# Patient Record
Sex: Female | Born: 1935 | Race: White | Hispanic: No | Marital: Single | Smoking: Never smoker
Health system: Southern US, Community
[De-identification: ages and names within clinical notes are randomized; demographics above are authoritative.]

## PROBLEM LIST (undated history)

## (undated) DIAGNOSIS — I10 Essential (primary) hypertension: Secondary | ICD-10-CM

## (undated) DIAGNOSIS — F419 Anxiety disorder, unspecified: Secondary | ICD-10-CM

## (undated) DIAGNOSIS — J189 Pneumonia, unspecified organism: Secondary | ICD-10-CM

## (undated) DIAGNOSIS — K219 Gastro-esophageal reflux disease without esophagitis: Secondary | ICD-10-CM

## (undated) DIAGNOSIS — R112 Nausea with vomiting, unspecified: Secondary | ICD-10-CM

## (undated) DIAGNOSIS — K900 Celiac disease: Secondary | ICD-10-CM

## (undated) DIAGNOSIS — Z9889 Other specified postprocedural states: Secondary | ICD-10-CM

## (undated) DIAGNOSIS — E039 Hypothyroidism, unspecified: Secondary | ICD-10-CM

## (undated) DIAGNOSIS — S37032A Laceration of left kidney, unspecified degree, initial encounter: Secondary | ICD-10-CM

## (undated) DIAGNOSIS — T8859XA Other complications of anesthesia, initial encounter: Secondary | ICD-10-CM

## (undated) DIAGNOSIS — S36039A Unspecified laceration of spleen, initial encounter: Secondary | ICD-10-CM

## (undated) DIAGNOSIS — G43909 Migraine, unspecified, not intractable, without status migrainosus: Secondary | ICD-10-CM

## (undated) DIAGNOSIS — S62101A Fracture of unspecified carpal bone, right wrist, initial encounter for closed fracture: Secondary | ICD-10-CM

## (undated) DIAGNOSIS — M5430 Sciatica, unspecified side: Secondary | ICD-10-CM

## (undated) DIAGNOSIS — S32402A Unspecified fracture of left acetabulum, initial encounter for closed fracture: Secondary | ICD-10-CM

## (undated) DIAGNOSIS — T4145XA Adverse effect of unspecified anesthetic, initial encounter: Secondary | ICD-10-CM

## (undated) HISTORY — PX: CHOLECYSTECTOMY: SHX55

## (undated) HISTORY — PX: EYE SURGERY: SHX253

## (undated) HISTORY — PX: SPINE SURGERY: SHX786

## (undated) HISTORY — PX: ABDOMINAL HYSTERECTOMY: SHX81

---

## 2013-04-30 DIAGNOSIS — S36039A Unspecified laceration of spleen, initial encounter: Secondary | ICD-10-CM

## 2013-04-30 DIAGNOSIS — S32402A Unspecified fracture of left acetabulum, initial encounter for closed fracture: Secondary | ICD-10-CM

## 2013-04-30 DIAGNOSIS — S37032A Laceration of left kidney, unspecified degree, initial encounter: Secondary | ICD-10-CM

## 2013-04-30 HISTORY — DX: Unspecified fracture of left acetabulum, initial encounter for closed fracture: S32.402A

## 2013-04-30 HISTORY — DX: Unspecified laceration of spleen, initial encounter: S36.039A

## 2013-04-30 HISTORY — DX: Laceration of left kidney, unspecified degree, initial encounter: S37.032A

## 2013-05-27 ENCOUNTER — Encounter (HOSPITAL_COMMUNITY): Payer: Self-pay | Admitting: Emergency Medicine

## 2013-05-27 ENCOUNTER — Inpatient Hospital Stay (HOSPITAL_COMMUNITY): Payer: Medicare PPO

## 2013-05-27 ENCOUNTER — Emergency Department (HOSPITAL_COMMUNITY): Payer: Medicare PPO

## 2013-05-27 ENCOUNTER — Inpatient Hospital Stay (HOSPITAL_COMMUNITY)
Admission: EM | Admit: 2013-05-27 | Discharge: 2013-06-05 | DRG: 964 | Disposition: A | Discharge status: Skilled Nursing Facility | Payer: Medicare PPO | Attending: General Surgery | Admitting: General Surgery

## 2013-05-27 DIAGNOSIS — S62101A Fracture of unspecified carpal bone, right wrist, initial encounter for closed fracture: Secondary | ICD-10-CM

## 2013-05-27 DIAGNOSIS — M25571 Pain in right ankle and joints of right foot: Secondary | ICD-10-CM | POA: Diagnosis present

## 2013-05-27 DIAGNOSIS — S36039A Unspecified laceration of spleen, initial encounter: Secondary | ICD-10-CM

## 2013-05-27 DIAGNOSIS — M5430 Sciatica, unspecified side: Secondary | ICD-10-CM | POA: Diagnosis present

## 2013-05-27 DIAGNOSIS — I10 Essential (primary) hypertension: Secondary | ICD-10-CM | POA: Diagnosis present

## 2013-05-27 DIAGNOSIS — D62 Acute posthemorrhagic anemia: Secondary | ICD-10-CM | POA: Diagnosis present

## 2013-05-27 DIAGNOSIS — S36113A Laceration of liver, unspecified degree, initial encounter: Secondary | ICD-10-CM

## 2013-05-27 DIAGNOSIS — S32409A Unspecified fracture of unspecified acetabulum, initial encounter for closed fracture: Secondary | ICD-10-CM | POA: Diagnosis present

## 2013-05-27 DIAGNOSIS — S32402A Unspecified fracture of left acetabulum, initial encounter for closed fracture: Secondary | ICD-10-CM

## 2013-05-27 DIAGNOSIS — S37002A Unspecified injury of left kidney, initial encounter: Secondary | ICD-10-CM

## 2013-05-27 DIAGNOSIS — K5900 Constipation, unspecified: Secondary | ICD-10-CM | POA: Diagnosis not present

## 2013-05-27 DIAGNOSIS — Z7982 Long term (current) use of aspirin: Secondary | ICD-10-CM

## 2013-05-27 DIAGNOSIS — S37039A Laceration of unspecified kidney, unspecified degree, initial encounter: Secondary | ICD-10-CM | POA: Diagnosis present

## 2013-05-27 DIAGNOSIS — S3609XA Other injury of spleen, initial encounter: Principal | ICD-10-CM | POA: Diagnosis present

## 2013-05-27 DIAGNOSIS — S62109A Fracture of unspecified carpal bone, unspecified wrist, initial encounter for closed fracture: Secondary | ICD-10-CM | POA: Diagnosis present

## 2013-05-27 DIAGNOSIS — Z79899 Other long term (current) drug therapy: Secondary | ICD-10-CM

## 2013-05-27 HISTORY — DX: Essential (primary) hypertension: I10

## 2013-05-27 HISTORY — DX: Migraine, unspecified, not intractable, without status migrainosus: G43.909

## 2013-05-27 HISTORY — DX: Celiac disease: K90.0

## 2013-05-27 HISTORY — DX: Sciatica, unspecified side: M54.30

## 2013-05-27 LAB — CBC
HCT: 31.3 % — ABNORMAL LOW (ref 36.0–46.0)
MCH: 31.1 pg (ref 26.0–34.0)
MCHC: 34.5 g/dL (ref 30.0–36.0)
MCV: 90.2 fL (ref 78.0–100.0)
RDW: 13 % (ref 11.5–15.5)
WBC: 15.8 10*3/uL — ABNORMAL HIGH (ref 4.0–10.5)

## 2013-05-27 LAB — COMPREHENSIVE METABOLIC PANEL
Albumin: 3.4 g/dL — ABNORMAL LOW (ref 3.5–5.2)
BUN: 19 mg/dL (ref 6–23)
Calcium: 8.6 mg/dL (ref 8.4–10.5)
Creatinine, Ser: 1.06 mg/dL (ref 0.50–1.10)
Glucose, Bld: 113 mg/dL — ABNORMAL HIGH (ref 70–99)
Potassium: 3.9 mEq/L (ref 3.5–5.1)
Total Protein: 6.6 g/dL (ref 6.0–8.3)

## 2013-05-27 LAB — CBC WITH DIFFERENTIAL/PLATELET
Basophils Absolute: 0 10*3/uL (ref 0.0–0.1)
Eosinophils Absolute: 0 10*3/uL (ref 0.0–0.7)
HCT: 36 % (ref 36.0–46.0)
Lymphocytes Relative: 11 % — ABNORMAL LOW (ref 12–46)
MCHC: 34.4 g/dL (ref 30.0–36.0)
Monocytes Relative: 6 % (ref 3–12)
Neutro Abs: 21.6 10*3/uL — ABNORMAL HIGH (ref 1.7–7.7)
Platelets: 258 10*3/uL (ref 150–400)
RBC: 3.95 MIL/uL (ref 3.87–5.11)
RDW: 13.1 % (ref 11.5–15.5)
WBC: 26.1 10*3/uL — ABNORMAL HIGH (ref 4.0–10.5)

## 2013-05-27 LAB — CG4 I-STAT (LACTIC ACID): Lactic Acid, Venous: 2.05 mmol/L (ref 0.5–2.2)

## 2013-05-27 MED ORDER — PANCRELIPASE (LIP-PROT-AMYL) 12000-38000 UNITS PO CPEP
2.0000 | ORAL_CAPSULE | Freq: Three times a day (TID) | ORAL | Status: DC
  Administered 2013-05-29 – 2013-06-05 (×22): 2 via ORAL
  Filled 2013-05-27 (×28): qty 2

## 2013-05-27 MED ORDER — KCL IN DEXTROSE-NACL 20-5-0.45 MEQ/L-%-% IV SOLN
INTRAVENOUS | Status: DC
  Administered 2013-05-27: 50 mL/h via INTRAVENOUS
  Administered 2013-05-28 – 2013-05-30 (×2): via INTRAVENOUS
  Filled 2013-05-27 (×6): qty 1000

## 2013-05-27 MED ORDER — MORPHINE SULFATE 2 MG/ML IJ SOLN
2.0000 mg | INTRAMUSCULAR | Status: DC | PRN
  Administered 2013-05-27: 2 mg via INTRAVENOUS
  Administered 2013-05-27: 4 mg via INTRAVENOUS
  Administered 2013-05-28 (×5): 2 mg via INTRAVENOUS
  Filled 2013-05-27: qty 1
  Filled 2013-05-27: qty 2
  Filled 2013-05-27 (×5): qty 1

## 2013-05-27 MED ORDER — ONDANSETRON HCL 4 MG PO TABS: 4.0000 mg | ORAL_TABLET | Freq: Four times a day (QID) | ORAL | Status: DC | PRN

## 2013-05-27 MED ORDER — ONDANSETRON HCL 4 MG/2ML IJ SOLN
4.0000 mg | Freq: Four times a day (QID) | INTRAMUSCULAR | Status: DC | PRN
  Administered 2013-05-28 – 2013-06-04 (×3): 4 mg via INTRAVENOUS
  Filled 2013-05-27 (×4): qty 2

## 2013-05-27 MED ORDER — ALPRAZOLAM 0.5 MG PO TABS
1.0000 mg | ORAL_TABLET | Freq: Three times a day (TID) | ORAL | Status: DC | PRN
  Administered 2013-05-27 – 2013-06-05 (×23): 1 mg via ORAL
  Filled 2013-05-27 (×22): qty 2
  Filled 2013-05-27: qty 4
  Filled 2013-05-27 (×2): qty 2

## 2013-05-27 MED ORDER — PREDNISOLONE ACETATE 1 % OP SUSP
1.0000 [drp] | Freq: Two times a day (BID) | OPHTHALMIC | Status: DC
  Administered 2013-05-27 – 2013-06-05 (×18): 1 [drp] via OPHTHALMIC
  Filled 2013-05-27: qty 5
  Filled 2013-05-27: qty 1

## 2013-05-27 MED ORDER — LEVOTHYROXINE SODIUM 75 MCG PO TABS
75.0000 ug | ORAL_TABLET | Freq: Every day | ORAL | Status: DC
  Administered 2013-05-28 – 2013-06-05 (×9): 75 ug via ORAL
  Filled 2013-05-27 (×13): qty 1

## 2013-05-27 MED ORDER — LORAZEPAM 2 MG/ML IJ SOLN
0.5000 mg | Freq: Once | INTRAMUSCULAR | Status: AC
  Administered 2013-05-27: 0.5 mg via INTRAVENOUS
  Filled 2013-05-27: qty 1

## 2013-05-27 MED ORDER — METOPROLOL SUCCINATE ER 25 MG PO TB24
25.0000 mg | ORAL_TABLET | Freq: Two times a day (BID) | ORAL | Status: DC
  Administered 2013-05-31 – 2013-06-05 (×7): 25 mg via ORAL
  Filled 2013-05-27 (×22): qty 1

## 2013-05-27 MED ORDER — PANTOPRAZOLE SODIUM 40 MG PO TBEC
40.0000 mg | DELAYED_RELEASE_TABLET | Freq: Every day | ORAL | Status: DC
  Administered 2013-05-30 – 2013-06-05 (×7): 40 mg via ORAL
  Filled 2013-05-27 (×7): qty 1

## 2013-05-27 MED ORDER — PANCRELIPASE (LIP-PROT-AMYL) 12000-38000 UNITS PO CPEP: 1.0000 | ORAL_CAPSULE | ORAL | Status: DC

## 2013-05-27 MED ORDER — TRAMADOL HCL 50 MG PO TABS
50.0000 mg | ORAL_TABLET | Freq: Four times a day (QID) | ORAL | Status: DC | PRN
  Administered 2013-05-28 – 2013-05-30 (×5): 50 mg via ORAL
  Filled 2013-05-27 (×5): qty 1

## 2013-05-27 MED ORDER — IOHEXOL 300 MG/ML  SOLN
100.0000 mL | Freq: Once | INTRAMUSCULAR | Status: AC | PRN
  Administered 2013-05-27: 100 mL via INTRAVENOUS

## 2013-05-27 MED ORDER — PANTOPRAZOLE SODIUM 40 MG IV SOLR
40.0000 mg | Freq: Every day | INTRAVENOUS | Status: DC
  Administered 2013-05-27 – 2013-05-29 (×3): 40 mg via INTRAVENOUS
  Filled 2013-05-27 (×4): qty 40
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
## 2013-05-27 NOTE — ED Notes (Signed)
Pt returned to exam room. c-collar removed per EDP. Vital signs stable.
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
## 2013-05-27 NOTE — ED Notes (Signed)
Pt sleepy and lethargic at the time. Responding to painful stimuli. Hypotensive on cardiac monitor. Family at bedside. EDP also at bedside, to have STAT CT scans per Dr. Wilkie Aye.

## 2013-05-27 NOTE — ED Notes (Signed)
Per Aundra Millet, RN- Dr. Wilkie Aye is now waiving labs, and wants to proceed with contrast CT without labs
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
## 2013-05-27 NOTE — ED Notes (Signed)
Pt remains in x-ray and CT scan.
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
## 2013-05-27 NOTE — ED Notes (Signed)
Pt transported to CT scan.

## 2013-05-27 NOTE — ED Notes (Signed)
Foley catheter inserted without difficulty. No urine output at the time. Vital signs stable. Pt and family updated on plan for admission.

## 2013-05-27 NOTE — ED Notes (Signed)
(  3) silver bracelets, (1) set of silver starfish earrings and (1) green ring placed in container. Clothing cut off, black boots placed in bag.

## 2013-05-28 ENCOUNTER — Inpatient Hospital Stay (HOSPITAL_COMMUNITY): Payer: Medicare PPO

## 2013-05-28 DIAGNOSIS — M5430 Sciatica, unspecified side: Secondary | ICD-10-CM | POA: Insufficient documentation

## 2013-05-28 DIAGNOSIS — G43909 Migraine, unspecified, not intractable, without status migrainosus: Secondary | ICD-10-CM | POA: Insufficient documentation

## 2013-05-28 DIAGNOSIS — K900 Celiac disease: Secondary | ICD-10-CM | POA: Insufficient documentation

## 2013-05-28 DIAGNOSIS — I10 Essential (primary) hypertension: Secondary | ICD-10-CM | POA: Insufficient documentation

## 2013-05-28 DIAGNOSIS — D62 Acute posthemorrhagic anemia: Secondary | ICD-10-CM

## 2013-05-28 LAB — BASIC METABOLIC PANEL
BUN: 18 mg/dL (ref 6–23)
CO2: 24 mEq/L (ref 19–32)
Calcium: 7.9 mg/dL — ABNORMAL LOW (ref 8.4–10.5)
Chloride: 103 mEq/L (ref 96–112)
Creatinine, Ser: 0.93 mg/dL (ref 0.50–1.10)
GFR calc Af Amer: 66 mL/min — ABNORMAL LOW (ref >90)
GFR calc non Af Amer: 57 mL/min — ABNORMAL LOW (ref >90)

## 2013-05-28 LAB — URINALYSIS, ROUTINE W REFLEX MICROSCOPIC
Leukocytes, UA: NEGATIVE
Nitrite: NEGATIVE
Specific Gravity, Urine: 1.01 (ref 1.005–1.030)
pH: 7 (ref 5.0–8.0)

## 2013-05-28 LAB — CBC
HCT: 26.6 % — ABNORMAL LOW (ref 36.0–46.0)
HCT: 28.1 % — ABNORMAL LOW (ref 36.0–46.0)
Hemoglobin: 9 g/dL — ABNORMAL LOW (ref 12.0–15.0)
Hemoglobin: 9.7 g/dL — ABNORMAL LOW (ref 12.0–15.0)
MCH: 30.7 pg (ref 26.0–34.0)
MCH: 31.5 pg (ref 26.0–34.0)
MCHC: 34.1 g/dL (ref 30.0–36.0)
MCHC: 35.7 g/dL (ref 30.0–36.0)
MCHC: 34.5 g/dL (ref 30.0–36.0)
MCV: 89 fL (ref 78.0–100.0)
Platelets: 173 10*3/uL (ref 150–400)
Platelets: 175 10*3/uL (ref 150–400)
RBC: 2.99 MIL/uL — ABNORMAL LOW (ref 3.87–5.11)
RBC: 3.08 MIL/uL — ABNORMAL LOW (ref 3.87–5.11)
RDW: 13.1 % (ref 11.5–15.5)
RDW: 13.2 % (ref 11.5–15.5)
WBC: 11.4 10*3/uL — ABNORMAL HIGH (ref 4.0–10.5)
WBC: 10.5 10*3/uL (ref 4.0–10.5)

## 2013-05-28 LAB — PREPARE RBC (CROSSMATCH)

## 2013-05-28 LAB — PROTIME-INR: Prothrombin Time: 15.3 seconds — ABNORMAL HIGH (ref 11.6–15.2)

## 2013-05-28 MED ORDER — ALBUMIN HUMAN 5 % IV SOLN
12.5000 g | Freq: Once | INTRAVENOUS | Status: AC
  Administered 2013-05-28: 12.5 g via INTRAVENOUS
  Filled 2013-05-28: qty 250

## 2013-05-28 MED ORDER — CALCIUM CARBONATE ANTACID 500 MG PO CHEW
400.0000 mg | CHEWABLE_TABLET | ORAL | Status: DC | PRN
  Administered 2013-05-28 – 2013-05-29 (×2): 400 mg via ORAL
  Filled 2013-05-28 (×3): qty 2

## 2013-05-28 NOTE — Treatment Plan (Signed)
XRs of foot and ankle negative for fx - treat symptomatically F/u 2 weeks in office.  Mayra Reel, MD [facility] [phone] 6:03 PM
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
## 2013-05-28 NOTE — Progress Notes (Signed)
UR completed.  Zalmen Wrightsman, RN BSN [facility] CCM Trauma/Neuro ICU Case Manager [phone]  
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
## 2013-05-29 LAB — CBC
Hemoglobin: 9.4 g/dL — ABNORMAL LOW (ref 12.0–15.0)
MCH: 31.3 pg (ref 26.0–34.0)
MCV: 91.7 fL (ref 78.0–100.0)
Platelets: 180 10*3/uL (ref 150–400)
RBC: 3 MIL/uL — ABNORMAL LOW (ref 3.87–5.11)
RDW: 12.9 % (ref 11.5–15.5)
WBC: 13.1 10*3/uL — ABNORMAL HIGH (ref 4.0–10.5)

## 2013-05-29 LAB — URINE CULTURE
Colony Count: NO GROWTH
Culture: NO GROWTH

## 2013-05-29 MED ORDER — DIPHENHYDRAMINE HCL 50 MG/ML IJ SOLN
12.5000 mg | Freq: Four times a day (QID) | INTRAMUSCULAR | Status: DC | PRN
  Administered 2013-05-29 – 2013-06-01 (×6): 12.5 mg via INTRAVENOUS
  Filled 2013-05-29 (×6): qty 1

## 2013-05-29 MED ORDER — DIPHENHYDRAMINE HCL 50 MG/ML IJ SOLN: 12.5000 mg | Freq: Four times a day (QID) | INTRAMUSCULAR | Status: DC | PRN
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
## 2013-05-30 DIAGNOSIS — S62109A Fracture of unspecified carpal bone, unspecified wrist, initial encounter for closed fracture: Secondary | ICD-10-CM

## 2013-05-30 DIAGNOSIS — D62 Acute posthemorrhagic anemia: Secondary | ICD-10-CM | POA: Diagnosis not present

## 2013-05-30 DIAGNOSIS — S32409A Unspecified fracture of unspecified acetabulum, initial encounter for closed fracture: Secondary | ICD-10-CM

## 2013-05-30 DIAGNOSIS — S62101A Fracture of unspecified carpal bone, right wrist, initial encounter for closed fracture: Secondary | ICD-10-CM

## 2013-05-30 LAB — CBC
HCT: 28.2 % — ABNORMAL LOW (ref 36.0–46.0)
Hemoglobin: 9.5 g/dL — ABNORMAL LOW (ref 12.0–15.0)
MCH: 31 pg (ref 26.0–34.0)
MCHC: 33.7 g/dL (ref 30.0–36.0)
RBC: 3.06 MIL/uL — ABNORMAL LOW (ref 3.87–5.11)
RDW: 12.9 % (ref 11.5–15.5)
WBC: 15.2 10*3/uL — ABNORMAL HIGH (ref 4.0–10.5)

## 2013-05-30 LAB — BASIC METABOLIC PANEL
BUN: 10 mg/dL (ref 6–23)
CO2: 25 mEq/L (ref 19–32)
Chloride: 104 mEq/L (ref 96–112)
GFR calc Af Amer: 89 mL/min — ABNORMAL LOW (ref >90)
GFR calc non Af Amer: 77 mL/min — ABNORMAL LOW (ref >90)
Potassium: 4.4 mEq/L (ref 3.7–5.3)
Sodium: 137 mEq/L (ref 137–147)

## 2013-05-30 MED ORDER — TRAMADOL HCL 50 MG PO TABS
50.0000 mg | ORAL_TABLET | Freq: Four times a day (QID) | ORAL | Status: DC | PRN
  Administered 2013-05-30 – 2013-05-31 (×5): 100 mg via ORAL
  Administered 2013-06-01: 50 mg via ORAL
  Administered 2013-06-01: 100 mg via ORAL
  Administered 2013-06-02 (×2): 50 mg via ORAL
  Administered 2013-06-02 – 2013-06-05 (×8): 100 mg via ORAL
  Filled 2013-05-30 (×4): qty 2
  Filled 2013-05-30: qty 1
  Filled 2013-05-30 (×4): qty 2
  Filled 2013-05-30: qty 1
  Filled 2013-05-30 (×7): qty 2

## 2013-05-30 MED ORDER — MORPHINE SULFATE 2 MG/ML IJ SOLN
2.0000 mg | INTRAMUSCULAR | Status: DC | PRN
  Administered 2013-06-04: 2 mg via INTRAVENOUS
  Filled 2013-05-30: qty 1

## 2013-05-30 MED ORDER — METHOCARBAMOL 750 MG PO TABS
1500.0000 mg | ORAL_TABLET | Freq: Four times a day (QID) | ORAL | Status: DC
  Administered 2013-05-30 – 2013-06-05 (×25): 1500 mg via ORAL
  Filled 2013-05-30 (×28): qty 2

## 2013-05-30 MED ORDER — POLYETHYLENE GLYCOL 3350 17 G PO PACK
17.0000 g | PACK | Freq: Every day | ORAL | Status: DC
  Filled 2013-05-30 (×7): qty 1

## 2013-05-30 MED ORDER — DOCUSATE SODIUM 100 MG PO CAPS
100.0000 mg | ORAL_CAPSULE | Freq: Two times a day (BID) | ORAL | Status: DC
  Administered 2013-05-30 – 2013-06-05 (×13): 100 mg via ORAL
  Filled 2013-05-30 (×14): qty 1
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
## 2013-05-30 NOTE — Progress Notes (Signed)
Will get up with PT. Patient examined and I agree with the assessment and plan  [name], MD, MPH, FACS Pager: [phone]  05/30/2013 10:53 AM
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
## 2013-05-31 DIAGNOSIS — S62101A Fracture of unspecified carpal bone, right wrist, initial encounter for closed fracture: Secondary | ICD-10-CM

## 2013-05-31 HISTORY — DX: Fracture of unspecified carpal bone, right wrist, initial encounter for closed fracture: S62.101A

## 2013-05-31 LAB — CBC
HCT: 26.4 % — ABNORMAL LOW (ref 36.0–46.0)
Hemoglobin: 8.9 g/dL — ABNORMAL LOW (ref 12.0–15.0)
MCH: 30.8 pg (ref 26.0–34.0)
MCHC: 33.7 g/dL (ref 30.0–36.0)
MCV: 91.3 fL (ref 78.0–100.0)
Platelets: 223 10*3/uL (ref 150–400)
RBC: 2.89 MIL/uL — ABNORMAL LOW (ref 3.87–5.11)
RDW: 12.8 % (ref 11.5–15.5)
WBC: 14 10*3/uL — ABNORMAL HIGH (ref 4.0–10.5)
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
## 2013-06-01 LAB — CBC
HEMATOCRIT: 27.2 % — AB (ref 36.0–46.0)
HEMOGLOBIN: 9.3 g/dL — AB (ref 12.0–15.0)
MCH: 31 pg (ref 26.0–34.0)
MCHC: 34.2 g/dL (ref 30.0–36.0)
MCV: 90.7 fL (ref 78.0–100.0)
Platelets: 270 10*3/uL (ref 150–400)
RBC: 3 MIL/uL — AB (ref 3.87–5.11)
RDW: 12.7 % (ref 11.5–15.5)
WBC: 10.1 10*3/uL (ref 4.0–10.5)

## 2013-06-01 LAB — TYPE AND SCREEN
ABO/RH(D): A POS
Antibody Screen: NEGATIVE
Unit division: 0
Unit division: 0

## 2013-06-01 NOTE — Progress Notes (Signed)
Hope for SNF today Patient examined and I agree with the assessment and plan  Tex Conroy, MD, MPH, FACS Pager: [phone]  06/01/2013 10:07 AM  
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
## 2013-06-01 NOTE — Progress Notes (Addendum)
12:12 pm: [facility] are [facility]. CSW notified CM and patient of this change. CSW is waiting on insurance authorization at this time and PASRR number.  CSW spoke to patient and patient's sister by bedside. Per patient and family, patient is now wanting home health, because they are unable to private pay. CSW notified CM and is signing off at this time. Please re consult if social work needs arise.  CSW spoke to patient and patient's sister at bedside about SNF options. [facility]'s [facility], patient would have to pay privately to go to SNF because of the open insurance claim. CSW and SNF facility explained this to patient and patient's sister. Patient's sister stated they are not able to pay privately and may be interested in Home Health if other facilities have the same policy. CSW explained that she will check other facilities to see if they have the same policy. CSW gave family other options of Home Health as well. CSW is checking with other facilities and will update patient and family.  Maree KrabbeLindsay Masao Junker, MSW, Theresia MajorsLCSWA [phone]

## 2013-06-01 NOTE — Clinical Social Work Placement (Addendum)
Clinical Social Work Department CLINICAL SOCIAL WORK PLACEMENT NOTE 06/01/2013  Patient:  [name],[name]  Account Number:  [id number] Admit date:  05/27/2013  Clinical Social Worker:  Read DriversEGINA INGLE, LCSWA  Date/time:  06/01/2013 09:50 AM  Clinical Social Work is seeking post-discharge placement for this patient at the following level of care:   SKILLED NURSING   (*CSW will update this form in Epic as items are completed)   06/01/2013  Patient/family provided with [facility] Department of Clinical Social Work's list of facilities offering this level of care within the geographic area requested by the patient (or if unable, by the patient's family).  06/01/2013  Patient/family informed of their freedom to choose among providers that offer the needed level of care, that participate in Medicare, Medicaid or managed care program needed by the patient, have an available bed and are willing to accept the patient.  06/01/2013  Patient/family informed of [facility]' ownership interest in [facility], as well as of the fact that they are under no obligation to receive care at this facility.  [place] submitted to [employer] on 06/01/2013 [employer] number received from [employer] on 06/01/2013  FL2 transmitted to all facilities in geographic area requested by pt/family on  06/01/2013 FL2 transmitted to all facilities within larger geographic area on   Patient informed that his/her managed care company has contracts with or will negotiate with  certain facilities, including the following:     Patient/family informed of bed offers received:  06/03/2013 [facility] bed [facility]  [facility] Phys[facility] recommends and patient chooses bed at    [facility]   Patient to be transferred to facility by  Ambulance  The following physician request were entered in Epic:   Additional Comments:

## 2013-06-02 MED ORDER — MAGNESIUM HYDROXIDE 400 MG/5ML PO SUSP
30.0000 mL | Freq: Every day | ORAL | Status: DC
  Administered 2013-06-02 – 2013-06-05 (×3): 30 mL via ORAL
  Filled 2013-06-02 (×4): qty 30

## 2013-06-02 MED ORDER — DIPHENHYDRAMINE HCL 25 MG PO CAPS
25.0000 mg | ORAL_CAPSULE | Freq: Four times a day (QID) | ORAL | Status: DC | PRN
  Administered 2013-06-02: 25 mg via ORAL
  Filled 2013-06-02: qty 1
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
## 2013-06-04 NOTE — Clinical Social Work Note (Signed)
Clinical Social Worker continuing to follow patient and family for support and discharge planning needs.  CS[facility]  Patient is awaiting approval on out [place] approval due to [place] residence in [place].  [facility].  CSW has contacted and left message with Elberta SpanielDina Dolan [phone]) at [employer] regarding approval.  CSW has updated patient at bedside, treatment team, and facility in patient delay for discharge.  CSW remains available for support and to facilitate patient discharge once [place] number received.  Meghan GoldsJesse Traveion Mccarthy, Kentucky[place]CS[place] [phone]

## 2013-06-04 NOTE — Progress Notes (Signed)
Should be able to go when insurance is approved.  This patient has been seen and I agree with the findings and treatment plan.  Marta LamasJames O. Gae BonWyatt, III, MD, FACS [phone] (pager) [phone] (direct pager) Trauma Surgeon
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
## 2013-06-05 DIAGNOSIS — M25571 Pain in right ankle and joints of right foot: Secondary | ICD-10-CM | POA: Diagnosis present

## 2013-06-05 MED ORDER — METHOCARBAMOL 750 MG PO TABS: 1500.0000 mg | ORAL_TABLET | Freq: Four times a day (QID) | ORAL | Status: DC

## 2013-06-05 MED ORDER — DSS 100 MG PO CAPS: 100.0000 mg | ORAL_CAPSULE | Freq: Two times a day (BID) | ORAL | Status: DC

## 2013-06-05 MED ORDER — TRAMADOL HCL 50 MG PO TABS: 50.0000 mg | ORAL_TABLET | Freq: Four times a day (QID) | ORAL | Status: DC | PRN

## 2013-06-05 NOTE — Progress Notes (Signed)
Report given to Dahlia ClientSmith, Rn [facility]
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
## 2013-06-05 NOTE — Discharge Instructions (Signed)
Splenic Injury A splenic injury is an injury to the spleen. The spleen is an organ located in the upper left side of the abdomen, just under the ribs. The spleen filters and cleans the blood. It also stores blood cells and destroys cells that are worn out. The spleen is involved in fighting disease. However, when the spleen is removed, your body can still fight most diseases without it.  CAUSES  A blow to the abdomen can injure the spleen. In some cases, the spleen may only be bruised with some bleeding inside the covering and around the spleen. However, sometimes an injury to the spleen causes it to break (rupture). Illness can also cause the spleen to become enlarged and rupture. Because the spleen has a lot of blood going to it, a rupture is very serious and can be life-threatening. SYMPTOMS  Often, a minor splenic injury causes no symptoms or only minor abdominal pain. When bleeding from the injury is severe, the patient's blood pressure may rapidly decrease. This may cause some of the following problems:  Dizziness or lightheadedness.  Rapid heart rate.  Abdominal tenderness and swelling.  Fainting.  Sweating with clammy skin. DIAGNOSIS  Your caregiver may immediately know what is wrong by taking a history and doing a physical exam. If there is time, the diagnosis is often confirmed by a CT scan. Other imaging tests may also be done, such as an ultrasound exam or sometimes an MRI scan. Lab tests may be done to check your blood and may be needed frequently for a couple days after the injury.  TREATMENT   If the blood pressure is too low, emergency surgery may be needed.  When injuries are less severe, your surgeon may decide to observe the injury, or to treat the injury with interventional radiology.Interventional radiology uses flexible tubes (catheters) to stop the bleeding from inside the blood vessel. It can only be used under certain circumstances. Your caregiver will tell you if this  is an option.  One to several days in an intensive care unit (ICU) may be required. Fluid and blood levels will be monitored closely.Intravenous (IV) fluids and blood transfusions are sometimes needed. Follow-up scans may be used to check how the spleen is healing. The spleen may be able to heal itself. However, if the problems are getting worse, surgery may still be needed. HOME CARE INSTRUCTIONS  Keep all follow-up appointments as instructed by your caregiver. Even when the spleen appears to have healed well, your surgeon may want to continue following up on the injury.  Ask your caregiver if you need any immunizations. You may need certain immunizations depending on whether you had surgery, interventional radiology, or some other treatment for your spleen injury. SEEK IMMEDIATE MEDICAL CARE IF:  You have a fever.  Your abdominal pain gets worse.  You develop signs of infection, such as chills and feeling unwell. MAKE SURE YOU:  Understand these instructions.  Will watch your condition.  Will get help right away if you are not doing well or get worse. Document Released: 03/08/2006 Document Revised: 08/09/2011 Document Reviewed: 11/20/2010 [facility] Patient Information 2014 [place], [place].  Acute Kidney Injury Acute kidney injury is a disease in which there is sudden (acute) damage to the kidneys. The kidneys are 2 organs that lie on either side of the spine between the middle of the back and the front of the abdomen. The kidneys:  Remove wastes and extra water from the blood.   Produce important hormones. These help  keep bones strong, regulate blood pressure, and help create red blood cells.   Balance the fluids and chemicals in the blood and tissues. A small amount of kidney damage may not cause problems, but a large amount of damage may make it difficult or impossible for the kidneys to work the way they should. Acute kidney injury may develop into long-lasting (chronic)  kidney disease. It may also develop into a life-threatening disease called end-stage kidney disease. Acute kidney injury can get worse very quickly, so it should be treated right away. Early treatment may prevent other kidney diseases from developing.  CAUSES   A problem with blood flow to the kidneys. This may be caused by:   Blood loss.   Heart disease.   Severe burns.   Liver disease.  Direct damage to the kidneys. This may be caused by:  Some medicines.   A kidney infection.   Poisoning or consuming toxic substances.   A surgical wound.   A blow to the kidney area.   A problem with urine flow. This may be caused by:   Cancer.   Kidney stones.   An enlarged prostate. SYMPTOMS   Swelling (edema) of the legs, ankles, or feet.   Tiredness (lethargy).   Nausea or vomiting.   Confusion.   Problems with urination, such as:   Painful or burning feeling during urination.   Decreased urine production.   Frequent accidents in children who are potty trained.   Bloody urine.   Muscle twitches and cramps.   Shortness of breath.   Seizures.   Chest pain or pressure. Sometimes, no symptoms are present. DIAGNOSIS Acute kidney injury may be detected and diagnosed by tests, including blood, urine, imaging, or kidney biopsy tests.  TREATMENT Treatment of acute kidney injury varies depending on the cause and severity of the kidney damage. In mild cases, no treatment may be needed. The kidneys may heal on their own. If acute kidney injury is more severe, your caregiver will treat the cause of the kidney damage, help the kidneys heal, and prevent complications from occurring. Severe cases may require a procedure to remove toxic wastes from the body (dialysis) or surgery to repair kidney damage. Surgery may involve:   Repair of a torn kidney.   Removal of an obstruction. Most of the time, you will need to stay overnight at the hospital.    HOME CARE INSTRUCTIONS:  Follow your prescribed diet.  Only take over-the-counter or prescription medicines as directed by your caregiver.  Do not take any new medicines (prescription, over-the-counter, or nutritional supplements) unless approved by your caregiver. Many medicines can worsen your kidney damage or need to have the dose adjusted.   Keep all follow-up appointments as directed by your caregiver.  Observe your condition to make sure you are healing as expected. SEEK IMMEDIATE MEDICAL CARE IF:  You are feeling ill or have severe pain in the back or side.   Your symptoms return or you have new symptoms.  You have any symptoms of end-stage kidney disease. These include:   Persistent itchiness.   Loss of appetite.   Headaches.   Abnormally dark or light skin.  Numbness in the hands or feet.   Easy bruising.   Frequent hiccups.   Menstruation stops.   You have a fever.  You have increased urine production.  You have pain or bleeding when urinating. MAKE SURE YOU:   Understand these instructions.  Will watch your condition.  Will get help right  away if you are not doing well or get worse Document Released: 11/30/2010 Document Revised: 09/11/2012 Document Reviewed: 01/14/2012 [facility] Patient Information 2014 [place], [place].

## 2013-06-05 NOTE — Progress Notes (Signed)
[facility]. Alert and oriented , not in any distress. Report given to receiving facility/nurse.

## 2013-06-05 NOTE — Discharge Summary (Signed)
Kaysee Hergert, MD, MPH, FACS Pager: [phone]  

## 2013-06-05 NOTE — Clinical Social Work Note (Signed)
Clinical Social Worker facilitated patient discharge including contacting patient family and facility to confirm patient discharge plans.  [facility] and Psychologist, forensicasarr received.  Clinical information faxed to facility and family agreeable with plan.  CSW arranged [facility] [place].  RN to call report prior to discharge.  Clinical Social Worker will sign off for now as social work intervention is no longer needed. Please consult us again if new need arises.  Meghan GoldsJesse Alechia Mccarthy, [place] [phone]

## 2013-06-08 ENCOUNTER — Encounter: Payer: Self-pay | Admitting: Internal Medicine

## 2013-06-08 ENCOUNTER — Non-Acute Institutional Stay (SKILLED_NURSING_FACILITY): Payer: Medicare HMO | Admitting: Internal Medicine

## 2013-06-08 DIAGNOSIS — K219 Gastro-esophageal reflux disease without esophagitis: Secondary | ICD-10-CM

## 2013-06-08 DIAGNOSIS — S62101K Fracture of unspecified carpal bone, right wrist, subsequent encounter for fracture with nonunion: Secondary | ICD-10-CM

## 2013-06-08 DIAGNOSIS — R269 Unspecified abnormalities of gait and mobility: Secondary | ICD-10-CM

## 2013-06-08 DIAGNOSIS — F411 Generalized anxiety disorder: Secondary | ICD-10-CM | POA: Insufficient documentation

## 2013-06-08 DIAGNOSIS — R2681 Unsteadiness on feet: Secondary | ICD-10-CM | POA: Insufficient documentation

## 2013-06-08 DIAGNOSIS — D62 Acute posthemorrhagic anemia: Secondary | ICD-10-CM

## 2013-06-08 DIAGNOSIS — IMO0002 Reserved for concepts with insufficient information to code with codable children: Secondary | ICD-10-CM

## 2013-06-08 DIAGNOSIS — E039 Hypothyroidism, unspecified: Secondary | ICD-10-CM | POA: Insufficient documentation

## 2013-06-08 DIAGNOSIS — I10 Essential (primary) hypertension: Secondary | ICD-10-CM
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
## 2013-07-01 DIAGNOSIS — J189 Pneumonia, unspecified organism: Secondary | ICD-10-CM

## 2013-07-01 HISTORY — DX: Pneumonia, unspecified organism: J18.9

## 2013-07-05 ENCOUNTER — Encounter: Payer: Self-pay | Admitting: Internal Medicine

## 2013-07-05 ENCOUNTER — Non-Acute Institutional Stay (SKILLED_NURSING_FACILITY): Payer: Medicare HMO | Admitting: Internal Medicine

## 2013-07-05 DIAGNOSIS — S62101A Fracture of unspecified carpal bone, right wrist, initial encounter for closed fracture: Secondary | ICD-10-CM

## 2013-07-05 DIAGNOSIS — K219 Gastro-esophageal reflux disease without esophagitis: Secondary | ICD-10-CM

## 2013-07-05 DIAGNOSIS — F411 Generalized anxiety disorder: Secondary | ICD-10-CM

## 2013-07-05 DIAGNOSIS — S62109A Fracture of unspecified carpal bone, unspecified wrist, initial encounter for closed fracture: Secondary | ICD-10-CM

## 2013-07-05 DIAGNOSIS — D62 Acute posthemorrhagic anemia: Secondary | ICD-10-CM

## 2013-07-05 DIAGNOSIS — I10 Essential (primary) hypertension: Secondary | ICD-10-CM

## 2013-07-05 DIAGNOSIS — E039 Hypothyroidism, unspecified: Secondary | ICD-10-CM
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
## 2013-07-06 ENCOUNTER — Non-Acute Institutional Stay (SKILLED_NURSING_FACILITY): Payer: Medicare HMO | Admitting: Internal Medicine

## 2013-07-06 DIAGNOSIS — S62101A Fracture of unspecified carpal bone, right wrist, initial encounter for closed fracture: Secondary | ICD-10-CM

## 2013-07-06 DIAGNOSIS — F411 Generalized anxiety disorder: Secondary | ICD-10-CM

## 2013-07-06 DIAGNOSIS — S62109A Fracture of unspecified carpal bone, unspecified wrist, initial encounter for closed fracture: Secondary | ICD-10-CM

## 2013-07-06 DIAGNOSIS — I10 Essential (primary) hypertension: Secondary | ICD-10-CM

## 2013-07-06 DIAGNOSIS — E039 Hypothyroidism, unspecified: Secondary | ICD-10-CM

## 2013-07-06 DIAGNOSIS — D62 Acute posthemorrhagic anemia: Secondary | ICD-10-CM
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
## 2013-07-11 ENCOUNTER — Encounter (HOSPITAL_COMMUNITY): Payer: Self-pay | Admitting: Emergency Medicine

## 2013-07-11 ENCOUNTER — Inpatient Hospital Stay (HOSPITAL_COMMUNITY)
Admission: EM | Admit: 2013-07-11 | Discharge: 2013-07-16 | DRG: 193 | Disposition: A | Discharge status: Home / Self Care | Payer: Medicare PPO | Attending: Internal Medicine | Admitting: Internal Medicine

## 2013-07-11 ENCOUNTER — Emergency Department (HOSPITAL_COMMUNITY): Payer: Medicare PPO

## 2013-07-11 DIAGNOSIS — N182 Chronic kidney disease, stage 2 (mild): Secondary | ICD-10-CM | POA: Diagnosis present

## 2013-07-11 DIAGNOSIS — N183 Chronic kidney disease, stage 3 unspecified: Secondary | ICD-10-CM

## 2013-07-11 DIAGNOSIS — J9600 Acute respiratory failure, unspecified whether with hypoxia or hypercapnia: Secondary | ICD-10-CM | POA: Diagnosis present

## 2013-07-11 DIAGNOSIS — I10 Essential (primary) hypertension: Secondary | ICD-10-CM | POA: Diagnosis present

## 2013-07-11 DIAGNOSIS — S32402A Unspecified fracture of left acetabulum, initial encounter for closed fracture: Secondary | ICD-10-CM

## 2013-07-11 DIAGNOSIS — E039 Hypothyroidism, unspecified: Secondary | ICD-10-CM | POA: Diagnosis present

## 2013-07-11 DIAGNOSIS — I129 Hypertensive chronic kidney disease with stage 1 through stage 4 chronic kidney disease, or unspecified chronic kidney disease: Secondary | ICD-10-CM | POA: Diagnosis present

## 2013-07-11 DIAGNOSIS — K900 Celiac disease: Secondary | ICD-10-CM | POA: Diagnosis present

## 2013-07-11 DIAGNOSIS — S62109A Fracture of unspecified carpal bone, unspecified wrist, initial encounter for closed fracture: Secondary | ICD-10-CM | POA: Diagnosis present

## 2013-07-11 DIAGNOSIS — T424X5A Adverse effect of benzodiazepines, initial encounter: Secondary | ICD-10-CM | POA: Diagnosis not present

## 2013-07-11 DIAGNOSIS — E876 Hypokalemia: Secondary | ICD-10-CM | POA: Diagnosis present

## 2013-07-11 DIAGNOSIS — J189 Pneumonia, unspecified organism: Principal | ICD-10-CM | POA: Diagnosis present

## 2013-07-11 DIAGNOSIS — F411 Generalized anxiety disorder: Secondary | ICD-10-CM | POA: Diagnosis present

## 2013-07-11 DIAGNOSIS — S52599A Other fractures of lower end of unspecified radius, initial encounter for closed fracture: Secondary | ICD-10-CM

## 2013-07-11 DIAGNOSIS — R404 Transient alteration of awareness: Secondary | ICD-10-CM | POA: Diagnosis not present

## 2013-07-11 DIAGNOSIS — R443 Hallucinations, unspecified: Secondary | ICD-10-CM | POA: Diagnosis present

## 2013-07-11 DIAGNOSIS — R410 Disorientation, unspecified: Secondary | ICD-10-CM | POA: Diagnosis present

## 2013-07-11 DIAGNOSIS — A0472 Enterocolitis due to Clostridium difficile, not specified as recurrent: Secondary | ICD-10-CM | POA: Diagnosis not present

## 2013-07-11 DIAGNOSIS — K219 Gastro-esophageal reflux disease without esophagitis: Secondary | ICD-10-CM | POA: Diagnosis present

## 2013-07-11 DIAGNOSIS — X58XXXA Exposure to other specified factors, initial encounter: Secondary | ICD-10-CM

## 2013-07-11 HISTORY — DX: Hypothyroidism, unspecified: E03.9

## 2013-07-11 HISTORY — DX: Other complications of anesthesia, initial encounter: T88.59XA

## 2013-07-11 HISTORY — DX: Adverse effect of unspecified anesthetic, initial encounter: T41.45XA

## 2013-07-11 HISTORY — DX: Gastro-esophageal reflux disease without esophagitis: K21.9

## 2013-07-11 HISTORY — DX: Nausea with vomiting, unspecified: R11.2

## 2013-07-11 HISTORY — DX: Anxiety disorder, unspecified: F41.9

## 2013-07-11 HISTORY — DX: Other specified postprocedural states: R11.2

## 2013-07-11 HISTORY — DX: Pneumonia, unspecified organism: J18.9

## 2013-07-11 HISTORY — DX: Unspecified fracture of left acetabulum, initial encounter for closed fracture: S32.402A

## 2013-07-11 HISTORY — DX: Unspecified laceration of spleen, initial encounter: S36.039A

## 2013-07-11 HISTORY — DX: Other specified postprocedural states: Z98.890

## 2013-07-11 HISTORY — DX: Nausea with vomiting, unspecified: Z98.890

## 2013-07-11 HISTORY — DX: Laceration of left kidney, unspecified degree, initial encounter: S37.032A

## 2013-07-11 HISTORY — DX: Fracture of unspecified carpal bone, right wrist, initial encounter for closed fracture: S62.101A

## 2013-07-11 LAB — CBC WITH DIFFERENTIAL/PLATELET
BASOS PCT: 0 % (ref 0–1)
Basophils Absolute: 0 10*3/uL (ref 0.0–0.1)
EOS PCT: 0 % (ref 0–5)
Eosinophils Absolute: 0 10*3/uL (ref 0.0–0.7)
HCT: 36.3 % (ref 36.0–46.0)
Hemoglobin: 12.4 g/dL (ref 12.0–15.0)
Lymphocytes Relative: 42 % (ref 12–46)
Lymphs Abs: 3.2 10*3/uL (ref 0.7–4.0)
MCH: 29.9 pg (ref 26.0–34.0)
MCHC: 34.2 g/dL (ref 30.0–36.0)
MCV: 87.5 fL (ref 78.0–100.0)
Monocytes Absolute: 0.7 10*3/uL (ref 0.1–1.0)
Monocytes Relative: 9 % (ref 3–12)
NEUTROS PCT: 49 % (ref 43–77)
Neutro Abs: 3.7 10*3/uL (ref 1.7–7.7)
Platelets: 230 10*3/uL (ref 150–400)
RBC: 4.15 MIL/uL (ref 3.87–5.11)
RDW: 12.4 % (ref 11.5–15.5)
WBC: 7.6 10*3/uL (ref 4.0–10.5)

## 2013-07-11 LAB — HIV ANTIBODY (ROUTINE TESTING W REFLEX): HIV: NONREACTIVE

## 2013-07-11 LAB — URINALYSIS, ROUTINE W REFLEX MICROSCOPIC
GLUCOSE, UA: NEGATIVE mg/dL
HGB URINE DIPSTICK: NEGATIVE
Ketones, ur: 15 mg/dL — AB
Leukocytes, UA: NEGATIVE
Nitrite: NEGATIVE
PH: 6 (ref 5.0–8.0)
Protein, ur: 30 mg/dL — AB
Specific Gravity, Urine: 1.03 (ref 1.005–1.030)
Urobilinogen, UA: 1 mg/dL (ref 0.0–1.0)

## 2013-07-11 LAB — COMPREHENSIVE METABOLIC PANEL
ALBUMIN: 3.2 g/dL — AB (ref 3.5–5.2)
ALK PHOS: 117 U/L (ref 39–117)
ALT: 25 U/L (ref 0–35)
AST: 43 U/L — ABNORMAL HIGH (ref 0–37)
BUN: 14 mg/dL (ref 6–23)
CO2: 27 mEq/L (ref 19–32)
Calcium: 8.9 mg/dL (ref 8.4–10.5)
Chloride: 97 mEq/L (ref 96–112)
Creatinine, Ser: 0.9 mg/dL (ref 0.50–1.10)
GFR calc non Af Amer: 59 mL/min — ABNORMAL LOW (ref >90)
GFR, EST AFRICAN AMERICAN: 68 mL/min — AB (ref >90)
Glucose, Bld: 104 mg/dL — ABNORMAL HIGH (ref 70–99)
POTASSIUM: 3.2 meq/L — AB (ref 3.7–5.3)
SODIUM: 140 meq/L (ref 137–147)
TOTAL PROTEIN: 7.1 g/dL (ref 6.0–8.3)
Total Bilirubin: 0.7 mg/dL (ref 0.3–1.2)

## 2013-07-11 LAB — URINE MICROSCOPIC-ADD ON

## 2013-07-11 LAB — LACTIC ACID, PLASMA: LACTIC ACID, VENOUS: 1.6 mmol/L (ref 0.5–2.2)

## 2013-07-11 LAB — LIPASE, BLOOD: Lipase: 17 U/L (ref 11–59)

## 2013-07-11 LAB — TROPONIN I

## 2013-07-11 MED ORDER — ONDANSETRON HCL 4 MG/2ML IJ SOLN: 4.0000 mg | Freq: Four times a day (QID) | INTRAMUSCULAR | Status: DC | PRN

## 2013-07-11 MED ORDER — BIOTENE DRY MOUTH MT LIQD
15.0000 mL | Freq: Two times a day (BID) | OROMUCOSAL | Status: DC
  Administered 2013-07-13 – 2013-07-16 (×5): 15 mL via OROMUCOSAL

## 2013-07-11 MED ORDER — PANTOPRAZOLE SODIUM 40 MG PO TBEC
40.0000 mg | DELAYED_RELEASE_TABLET | Freq: Every day | ORAL | Status: DC
  Administered 2013-07-11 – 2013-07-15 (×5): 40 mg via ORAL
  Filled 2013-07-11 (×5): qty 1

## 2013-07-11 MED ORDER — SODIUM CHLORIDE 0.9 % IV SOLN
INTRAVENOUS | Status: DC
  Administered 2013-07-11 – 2013-07-15 (×8): via INTRAVENOUS

## 2013-07-11 MED ORDER — VANCOMYCIN HCL IN DEXTROSE 1-5 GM/200ML-% IV SOLN
1000.0000 mg | Freq: Once | INTRAVENOUS | Status: AC
  Administered 2013-07-11: 1000 mg via INTRAVENOUS
  Filled 2013-07-11: qty 200

## 2013-07-11 MED ORDER — ESTRADIOL 0.05 MG/24HR TD PTWK
0.0500 mg | MEDICATED_PATCH | TRANSDERMAL | Status: DC
  Administered 2013-07-11: 0.05 mg via TRANSDERMAL
  Filled 2013-07-11: qty 1

## 2013-07-11 MED ORDER — ALBUTEROL SULFATE (2.5 MG/3ML) 0.083% IN NEBU: 2.5000 mg | INHALATION_SOLUTION | Freq: Four times a day (QID) | RESPIRATORY_TRACT | Status: DC | PRN

## 2013-07-11 MED ORDER — LEVOTHYROXINE SODIUM 75 MCG PO TABS
75.0000 ug | ORAL_TABLET | Freq: Every day | ORAL | Status: DC
  Administered 2013-07-12 – 2013-07-13 (×2): 75 ug via ORAL
  Filled 2013-07-11 (×3): qty 1

## 2013-07-11 MED ORDER — DEXTROSE 5 % IV SOLN
1.0000 g | INTRAVENOUS | Status: DC
  Administered 2013-07-12 – 2013-07-13 (×2): 1 g via INTRAVENOUS
  Filled 2013-07-11 (×2): qty 1

## 2013-07-11 MED ORDER — ALPRAZOLAM 0.5 MG PO TABS
1.0000 mg | ORAL_TABLET | Freq: Three times a day (TID) | ORAL | Status: DC | PRN
  Administered 2013-07-11: 1 mg via ORAL
  Filled 2013-07-11 (×2): qty 2

## 2013-07-11 MED ORDER — METOPROLOL SUCCINATE ER 25 MG PO TB24
25.0000 mg | ORAL_TABLET | Freq: Two times a day (BID) | ORAL | Status: DC
  Administered 2013-07-11 – 2013-07-14 (×7): 25 mg via ORAL
  Filled 2013-07-11 (×8): qty 1

## 2013-07-11 MED ORDER — DSS 100 MG PO CAPS: 100.0000 mg | ORAL_CAPSULE | Freq: Two times a day (BID) | ORAL | Status: DC

## 2013-07-11 MED ORDER — HEPARIN SODIUM (PORCINE) 5000 UNIT/ML IJ SOLN
5000.0000 [IU] | Freq: Three times a day (TID) | INTRAMUSCULAR | Status: DC
  Administered 2013-07-11 – 2013-07-12 (×3): 5000 [IU] via SUBCUTANEOUS
  Filled 2013-07-11 (×6): qty 1

## 2013-07-11 MED ORDER — DOCUSATE SODIUM 100 MG PO CAPS
100.0000 mg | ORAL_CAPSULE | Freq: Two times a day (BID) | ORAL | Status: DC
  Administered 2013-07-11 – 2013-07-13 (×3): 100 mg via ORAL
  Filled 2013-07-11 (×10): qty 1

## 2013-07-11 MED ORDER — POTASSIUM CHLORIDE CRYS ER 20 MEQ PO TBCR
40.0000 meq | EXTENDED_RELEASE_TABLET | Freq: Once | ORAL | Status: AC
  Administered 2013-07-11: 40 meq via ORAL
  Filled 2013-07-11: qty 2

## 2013-07-11 MED ORDER — DEXTROSE 5 % IV SOLN
1.0000 g | Freq: Once | INTRAVENOUS | Status: AC
  Administered 2013-07-11: 1 g via INTRAVENOUS
  Filled 2013-07-11: qty 1

## 2013-07-11 MED ORDER — SODIUM CHLORIDE 0.9 % IV SOLN
500.0000 mg | Freq: Two times a day (BID) | INTRAVENOUS | Status: DC
  Administered 2013-07-12 – 2013-07-13 (×3): 500 mg via INTRAVENOUS
  Filled 2013-07-11 (×4): qty 500

## 2013-07-11 NOTE — ED Notes (Signed)
While coughing pt O2 sats decreased to 87%, pt denies sob. Nad, skin warm and dry, pt placed on 1 liter/min oxygen through [place]. Pt reports she actually feels better on it.
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
## 2013-07-11 NOTE — ED Notes (Signed)
phlebotomy at bedside.  
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
## 2013-07-11 NOTE — ED Notes (Signed)
Admitting physician at bedside
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
## 2013-07-11 NOTE — Progress Notes (Signed)
Utilization review completed.  
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
## 2013-07-12 ENCOUNTER — Inpatient Hospital Stay (HOSPITAL_COMMUNITY): Payer: Medicare PPO

## 2013-07-12 DIAGNOSIS — R410 Disorientation, unspecified: Secondary | ICD-10-CM | POA: Diagnosis present

## 2013-07-12 LAB — BASIC METABOLIC PANEL
BUN: 14 mg/dL (ref 6–23)
CALCIUM: 9.2 mg/dL (ref 8.4–10.5)
CO2: 24 meq/L (ref 19–32)
Chloride: 105 mEq/L (ref 96–112)
Creatinine, Ser: 0.73 mg/dL (ref 0.50–1.10)
GFR calc Af Amer: >90 mL/min (ref >90)
GFR calc non Af Amer: 79 mL/min — ABNORMAL LOW (ref >90)
GLUCOSE: 144 mg/dL — AB (ref 70–99)
Potassium: 5.1 mEq/L (ref 3.7–5.3)
SODIUM: 140 meq/L (ref 137–147)

## 2013-07-12 LAB — CBC
HCT: 33.1 % — ABNORMAL LOW (ref 36.0–46.0)
HEMOGLOBIN: 11.3 g/dL — AB (ref 12.0–15.0)
MCH: 29.8 pg (ref 26.0–34.0)
MCHC: 34.1 g/dL (ref 30.0–36.0)
MCV: 87.3 fL (ref 78.0–100.0)
Platelets: 234 10*3/uL (ref 150–400)
RBC: 3.79 MIL/uL — AB (ref 3.87–5.11)
RDW: 12.4 % (ref 11.5–15.5)
WBC: 8.1 10*3/uL (ref 4.0–10.5)

## 2013-07-12 LAB — URINE CULTURE
COLONY COUNT: NO GROWTH
CULTURE: NO GROWTH

## 2013-07-12 LAB — STREP PNEUMONIAE URINARY ANTIGEN: Strep Pneumo Urinary Antigen: NEGATIVE

## 2013-07-12 LAB — LEGIONELLA ANTIGEN, URINE: Legionella Antigen, Urine: NEGATIVE

## 2013-07-12 LAB — TSH: TSH: 0.213 u[IU]/mL — ABNORMAL LOW (ref 0.350–4.500)

## 2013-07-12 MED ORDER — ENOXAPARIN SODIUM 80 MG/0.8ML ~~LOC~~ SOLN
1.0000 mg/kg | Freq: Two times a day (BID) | SUBCUTANEOUS | Status: DC
  Filled 2013-07-12 (×2): qty 0.8

## 2013-07-12 MED ORDER — HEPARIN SODIUM (PORCINE) 5000 UNIT/ML IJ SOLN
5000.0000 [IU] | Freq: Three times a day (TID) | INTRAMUSCULAR | Status: DC
  Administered 2013-07-12 – 2013-07-16 (×13): 5000 [IU] via SUBCUTANEOUS
  Filled 2013-07-12 (×15): qty 1

## 2013-07-12 MED ORDER — ALPRAZOLAM 0.5 MG PO TABS
0.5000 mg | ORAL_TABLET | Freq: Every day | ORAL | Status: DC | PRN
  Administered 2013-07-14 – 2013-07-15 (×2): 0.5 mg via ORAL
  Filled 2013-07-12 (×2): qty 1

## 2013-07-12 MED ORDER — IOHEXOL 350 MG/ML SOLN
80.0000 mL | Freq: Once | INTRAVENOUS | Status: AC | PRN
  Administered 2013-07-12: 80 mL via INTRAVENOUS
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
## 2013-07-12 NOTE — Care Management Note (Addendum)
    Page 1 of 2   07/13/2013     4:04:21 PM   CARE MANAGEMENT NOTE 07/13/2013  Patient:  [name],[name]   Account Number:  [id number]  Date Initiated:  07/12/2013  Documentation initiated by:  Letha CapeAYLOR,Deaisha Welborn  Subjective/Objective Assessment:   dx pna  admit- lives with sister.     Action/Plan:   pt/ot eval- rec hhpt and rolling walker   Anticipated DC Date:  07/14/2013   Anticipated DC Plan:  HOME W HOME HEALTH SERVICES      DC Planning Services  CM consult      [facility]   Choice offered to / List presented to:  C-1 Patient   DME arranged  OTHER - SEE COMMENT      DME agency  [facility]     HH arranged  HH-2 PT  HH-3 OT      Status of service:  In process, will continue to follow Medicare Important Message given?   (If response is "NO", the following Medicare IM given date fields will be blank) Date Medicare IM given:   Date Additional Medicare IM given:    Discharge Disposition:    Per UR Regulation:  Reviewed for med. necessity/level of care/duration of stay  If discussed at Long Length of Stay Meetings, dates discussed:    Comments:  07/13/13 1542 Letha Capeeborah Jevan Gaunt RN, BSN [phone] patient will need h[facility]pt , [facility]  Soc will begin 24-48 hrs post discharge.  [place].  Patient will also need a platform rolling walker attachment. NCM will fax order to [facility] and will get her sisters address. Sister is Pryor OchoaCarol Jones, her address is [address], [place] [id number], her cell phone is [phone].  07/12/13 1705 Letha Capeeborah Mechel Schutter RN BSN [phone] patient lives with sister, per physical thereapy recs hhpt and rolling walker, still awaiting for ot recs.  NCM will continue to follow for dc needs.
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
## 2013-07-12 NOTE — Progress Notes (Signed)
   I have seen the patient and reviewed the daily progress note by Delphia GratesSarah Stoneking, MS-4 and discussed the care of the patient with her. See my note from today for documentation of my findings, assessment, and plans.    LOS: 1 day   Genelle GatherKathryn F Tausha Milhoan, MD 07/12/2013, 12:18 PM

## 2013-07-13 LAB — BASIC METABOLIC PANEL
BUN: 11 mg/dL (ref 6–23)
CALCIUM: 8.9 mg/dL (ref 8.4–10.5)
CO2: 24 mEq/L (ref 19–32)
CREATININE: 0.75 mg/dL (ref 0.50–1.10)
Chloride: 106 mEq/L (ref 96–112)
GFR calc Af Amer: >90 mL/min (ref >90)
GFR, EST NON AFRICAN AMERICAN: 78 mL/min — AB (ref >90)
Glucose, Bld: 94 mg/dL (ref 70–99)
Potassium: 3.7 mEq/L (ref 3.7–5.3)
Sodium: 142 mEq/L (ref 137–147)

## 2013-07-13 LAB — CBC
HEMATOCRIT: 34.1 % — AB (ref 36.0–46.0)
Hemoglobin: 11.5 g/dL — ABNORMAL LOW (ref 12.0–15.0)
MCH: 29.5 pg (ref 26.0–34.0)
MCHC: 33.7 g/dL (ref 30.0–36.0)
MCV: 87.4 fL (ref 78.0–100.0)
Platelets: 282 10*3/uL (ref 150–400)
RBC: 3.9 MIL/uL (ref 3.87–5.11)
RDW: 12.7 % (ref 11.5–15.5)
WBC: 11.9 10*3/uL — ABNORMAL HIGH (ref 4.0–10.5)

## 2013-07-13 LAB — T4, FREE: Free T4: 1.36 ng/dL (ref 0.80–1.80)

## 2013-07-13 MED ORDER — LEVOTHYROXINE SODIUM 50 MCG PO TABS
50.0000 ug | ORAL_TABLET | Freq: Every day | ORAL | Status: DC
  Administered 2013-07-14 – 2013-07-16 (×3): 50 ug via ORAL
  Filled 2013-07-13 (×4): qty 1

## 2013-07-13 MED ORDER — LEVOFLOXACIN 750 MG PO TABS
750.0000 mg | ORAL_TABLET | ORAL | Status: DC
  Administered 2013-07-13 – 2013-07-15 (×3): 750 mg via ORAL
  Filled 2013-07-13 (×4): qty 1

## 2013-07-13 NOTE — Progress Notes (Signed)
  Date: 07/13/2013  Patient name: [name]  Medical record number: [id number]  Date of birth: 10/01/1930   This patient has been seen and the plan of care was discussed with the house staff. Please see their note for complete details. I concur with their findings with the following additions/corrections: Ms Theodis AguasGilreath was sitting in recliner. O2 off. Breathing better subjectively. She is worried about deconditioning. Afebrile and nl O2 sat. Will repeat now off O2. Able to speak in full sentences. Coarse BS and crackles B mid lungs. Plan is to await swallowing eval. Chang ABX to PO. Cont PT. Possible D/C in 24 - 48 hours.     Burns SpainElizabeth A Butcher, MD 07/13/2013, 11:15 AM
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
## 2013-07-14 DIAGNOSIS — R197 Diarrhea, unspecified: Secondary | ICD-10-CM

## 2013-07-14 DIAGNOSIS — A0472 Enterocolitis due to Clostridium difficile, not specified as recurrent: Secondary | ICD-10-CM | POA: Diagnosis not present

## 2013-07-14 LAB — CBC
HCT: 32.7 % — ABNORMAL LOW (ref 36.0–46.0)
HEMOGLOBIN: 11.1 g/dL — AB (ref 12.0–15.0)
MCH: 29.2 pg (ref 26.0–34.0)
MCHC: 33.9 g/dL (ref 30.0–36.0)
MCV: 86.1 fL (ref 78.0–100.0)
PLATELETS: 322 10*3/uL (ref 150–400)
RBC: 3.8 MIL/uL — ABNORMAL LOW (ref 3.87–5.11)
RDW: 12.6 % (ref 11.5–15.5)
WBC: 8.2 10*3/uL (ref 4.0–10.5)

## 2013-07-14 LAB — CLOSTRIDIUM DIFFICILE BY PCR: CDIFFPCR: POSITIVE — AB

## 2013-07-14 MED ORDER — POTASSIUM CHLORIDE CRYS ER 20 MEQ PO TBCR: 40.0000 meq | EXTENDED_RELEASE_TABLET | Freq: Once | ORAL | Status: DC

## 2013-07-14 MED ORDER — POTASSIUM CHLORIDE CRYS ER 20 MEQ PO TBCR
20.0000 meq | EXTENDED_RELEASE_TABLET | Freq: Once | ORAL | Status: AC
  Administered 2013-07-14: 20 meq via ORAL
  Filled 2013-07-14: qty 1

## 2013-07-14 MED ORDER — METOPROLOL SUCCINATE ER 50 MG PO TB24
50.0000 mg | ORAL_TABLET | Freq: Two times a day (BID) | ORAL | Status: DC
  Administered 2013-07-14 – 2013-07-16 (×4): 50 mg via ORAL
  Filled 2013-07-14 (×5): qty 1

## 2013-07-14 MED ORDER — METRONIDAZOLE 500 MG PO TABS
500.0000 mg | ORAL_TABLET | Freq: Three times a day (TID) | ORAL | Status: DC
  Administered 2013-07-14 – 2013-07-16 (×6): 500 mg via ORAL
  Filled 2013-07-14 (×8): qty 1

## 2013-07-14 NOTE — Progress Notes (Signed)
Internal Medicine Attending  Saw patient today. Feels much better, but having diarrhea since the past 2 days - profuse. Feels it is different from her usual IBS diarrhea. It is watery, and she goes every 2 hours. CDiff pending. Still has some cough. No fever since the past 48 hours.  Filed Vitals:   07/13/13 1328 07/13/13 2106 07/14/13 0535 07/14/13 0553  BP: 167/72 176/70 175/64 150/60  Pulse: 77 76 74   Temp: 98.4 F (36.9 C) 98.2 F (36.8 C) 98.2 F (36.8 C)   TempSrc: Oral Oral Oral   Resp: 18 18 18    Height:      Weight:      SpO2: 97% 100% 93%    . antiseptic oral rinse  15 mL Mouth Rinse BID  . docusate sodium  100 mg Oral BID  . estradiol  0.05 mg Transdermal Weekly  . heparin subcutaneous  5,000 Units Subcutaneous 3 times per day  . levofloxacin  750 mg Oral Q24H  . levothyroxine  50 mcg Oral QAC breakfast  . metoprolol succinate  50 mg Oral BID  . pantoprazole  40 mg Oral Daily  . potassium chloride  20 mEq Oral Once    I think we will give the patient one more day, since she is still having diarrhea and has just started feeling a little better. We will continue levaquin upon discharge, and complete a 14 day course for HCAp. Also, the BP of the patient is high and I agree with the increase in BB.

## 2013-07-14 NOTE — Progress Notes (Signed)
07/14/13 Placed on enteric precautions due to positive C-diff.
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
## 2013-07-16 DIAGNOSIS — A0472 Enterocolitis due to Clostridium difficile, not specified as recurrent: Secondary | ICD-10-CM

## 2013-07-16 MED ORDER — METOPROLOL SUCCINATE ER 25 MG PO TB24: 50.0000 mg | ORAL_TABLET | Freq: Two times a day (BID) | ORAL | Status: DC

## 2013-07-16 MED ORDER — LEVOFLOXACIN 750 MG PO TABS: 750.0000 mg | ORAL_TABLET | ORAL | Status: DC

## 2013-07-16 MED ORDER — METRONIDAZOLE 500 MG PO TABS: 500.0000 mg | ORAL_TABLET | Freq: Three times a day (TID) | ORAL | Status: DC

## 2013-07-16 MED ORDER — DSS 100 MG PO CAPS: 100.0000 mg | ORAL_CAPSULE | Freq: Every day | ORAL | Status: DC | PRN

## 2013-07-16 NOTE — Progress Notes (Signed)
Internal Medicine Attending  Date: 07/16/2013  Patient name: [name] Medical record number: [id number] Date of birth: 11/06/1930 Age: 80 y.o. Gender: female  I saw and evaluated the patient. I reviewed the resident's note by Dr. Everardo BealsSharda and I agree with the resident's findings and plans as documented in her progress note.  [name] feels better from a dyspnea standpoint when seen on rounds.  Her bowel movement frequency is also markedly decreased.  I agree with the housestaff's plan to discharge her home today on a total of an 8 day course of antibiotics (completing Levaquin on 2/18) for her pneumonia as well as completion of her course of oral flagyl for her C. diff colitis.
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
## 2013-07-16 NOTE — Progress Notes (Signed)
ANTIBIOTIC CONSULT NOTE - INITIAL  Pharmacy Consult for levofloxacin Indication: pneumonia  Allergies  Allergen Reactions  . Codeine     nausea  . Phenergan [Promethazine Hcl]     hallucinations    Patient Measurements: Height: 5\' 4"  (162.6 cm) Weight: 150 lb 3.2 oz (68.13 kg) IBW/kg (Calculated) : 54.7   Vital Signs: Temp: 97.6 F (36.4 C) (02/16 91470608) Temp src: Oral (02/16 0608) BP: 142/68 mmHg (02/16 0608) Pulse Rate: 62 (02/16 0608) Intake/Output from previous day: 02/15 0701 - 02/16 0700 In: 3066.7 [I.V.:3066.7] Out: -  Intake/Output from this shift:    Labs:  Recent Labs  07/14/13 0550  WBC 8.2  HGB 11.1*  PLT 322   Estimated Creatinine Clearance: 53.2 ml/min (by C-G formula based on Cr of 0.75). No results found for this basename: VANCOTROUGH, Leodis BinetVANCOPEAK, VANCORANDOM, GENTTROUGH, GENTPEAK, GENTRANDOM, TOBRATROUGH, TOBRAPEAK, TOBRARND, AMIKACINPEAK, AMIKACINTROU, AMIKACIN,  in the last 72 hours   Microbiology: Recent Results (from the past 720 hour(s))  URINE CULTURE     Status: None   Collection Time    07/11/13  9:05 AM      Result Value Ref Range Status   Specimen Description URINE, CATHETERIZED   Final   Special Requests NONE   Final   Culture  Setup Time     Final   Value: 07/11/2013 09:59     Performed at [facility]   Colony Count     Final   Value: NO GROWTH     Performed at [facility]   Culture     Final   Value: NO GROWTH     Performed at [facility]   Report Status 07/12/2013 FINAL   Final  CLOSTRIDIUM DIFFICILE BY PCR     Status: Abnormal   Collection Time    07/14/13  9:06 AM      Result Value Ref Range Status   C difficile by pcr POSITIVE (*) NEGATIVE Final   Comment: CRITICAL RESULT CALLED TO, READ BACK BY AND VERIFIED WITH:     ZELLNER,A RN 07/14/13 1458 WOOTEN,K   Assessment: 82 YOF on D# 4 Levaquin (D#6/14 abx) for HCAP, now afebrile, wbc wnl. Pt. Is also started on flagyl for positive C-diff.  Most recent scr 0.75 on 213, est. crcl > 50 ml/min.  vanc 2/11>> 2/13 Cefepime 2/11>>2/13 Levofloxacin 2/13>> Metronidazole PO 2/14>>   2/11 Urine Cx: neg  2/11 Sputum cx: no result  Goal of Therapy:  Eradication of infection  Plan:  1. Levofloxacin 750mg  PO daily 2. Follow for renal function, LOT, clinical progression 3. Please consider shorten length of antibiotics (recommended duration for HCAP is 8 days) given C-diff, or could consider switch to doxycycline after 8 days to finish the rest of the 14 day course?   Bayard HuggerMei Ramy Greth, PharmD, BCPS  Clinical Pharmacist  Pager: [phone]  07/16/2013 11:32 AM
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
## 2013-07-16 NOTE — Progress Notes (Signed)
Patient was discharged home by MD order; discharged instructions review and give to patient with care notes and prescriptions; IV DIC; skin intact; patient will be escorted to the car by nurse tech via wheelchair.  
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
## 2013-08-25 ENCOUNTER — Other Ambulatory Visit (HOSPITAL_COMMUNITY): Payer: Self-pay | Admitting: Internal Medicine

## 2013-09-01 ENCOUNTER — Other Ambulatory Visit: Payer: Self-pay | Admitting: Internal Medicine
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
## 2015-01-02 IMAGING — CR DG CHEST 2V
2 series · 2 of 2 positions shown · non-contrast
Comparison: Prior chest x-ray and CT chest 05/27/2013

CLINICAL DATA: Cough, shortness of breath

EXAM:
CHEST  2 VIEW

[w chest pa]
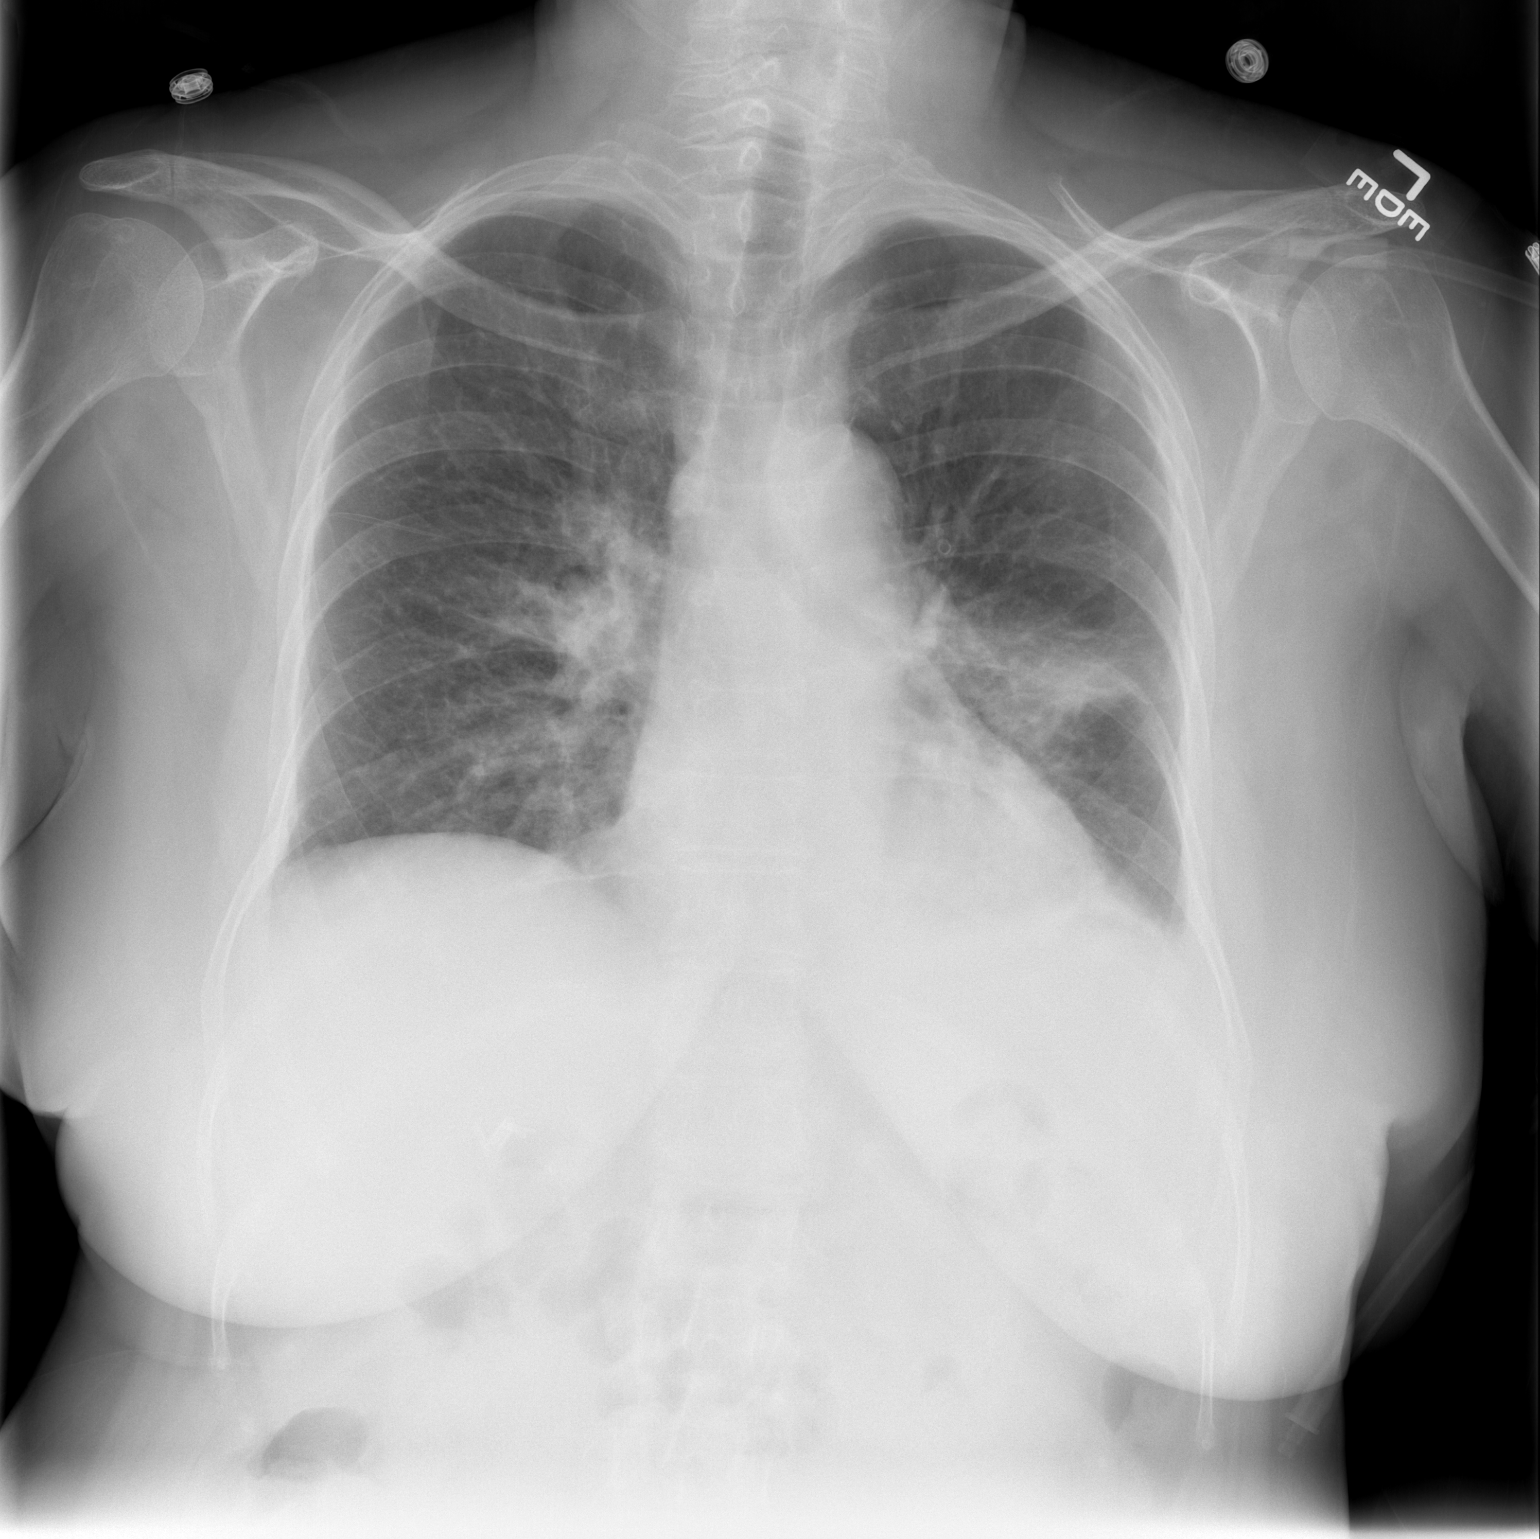

[w chest lat]
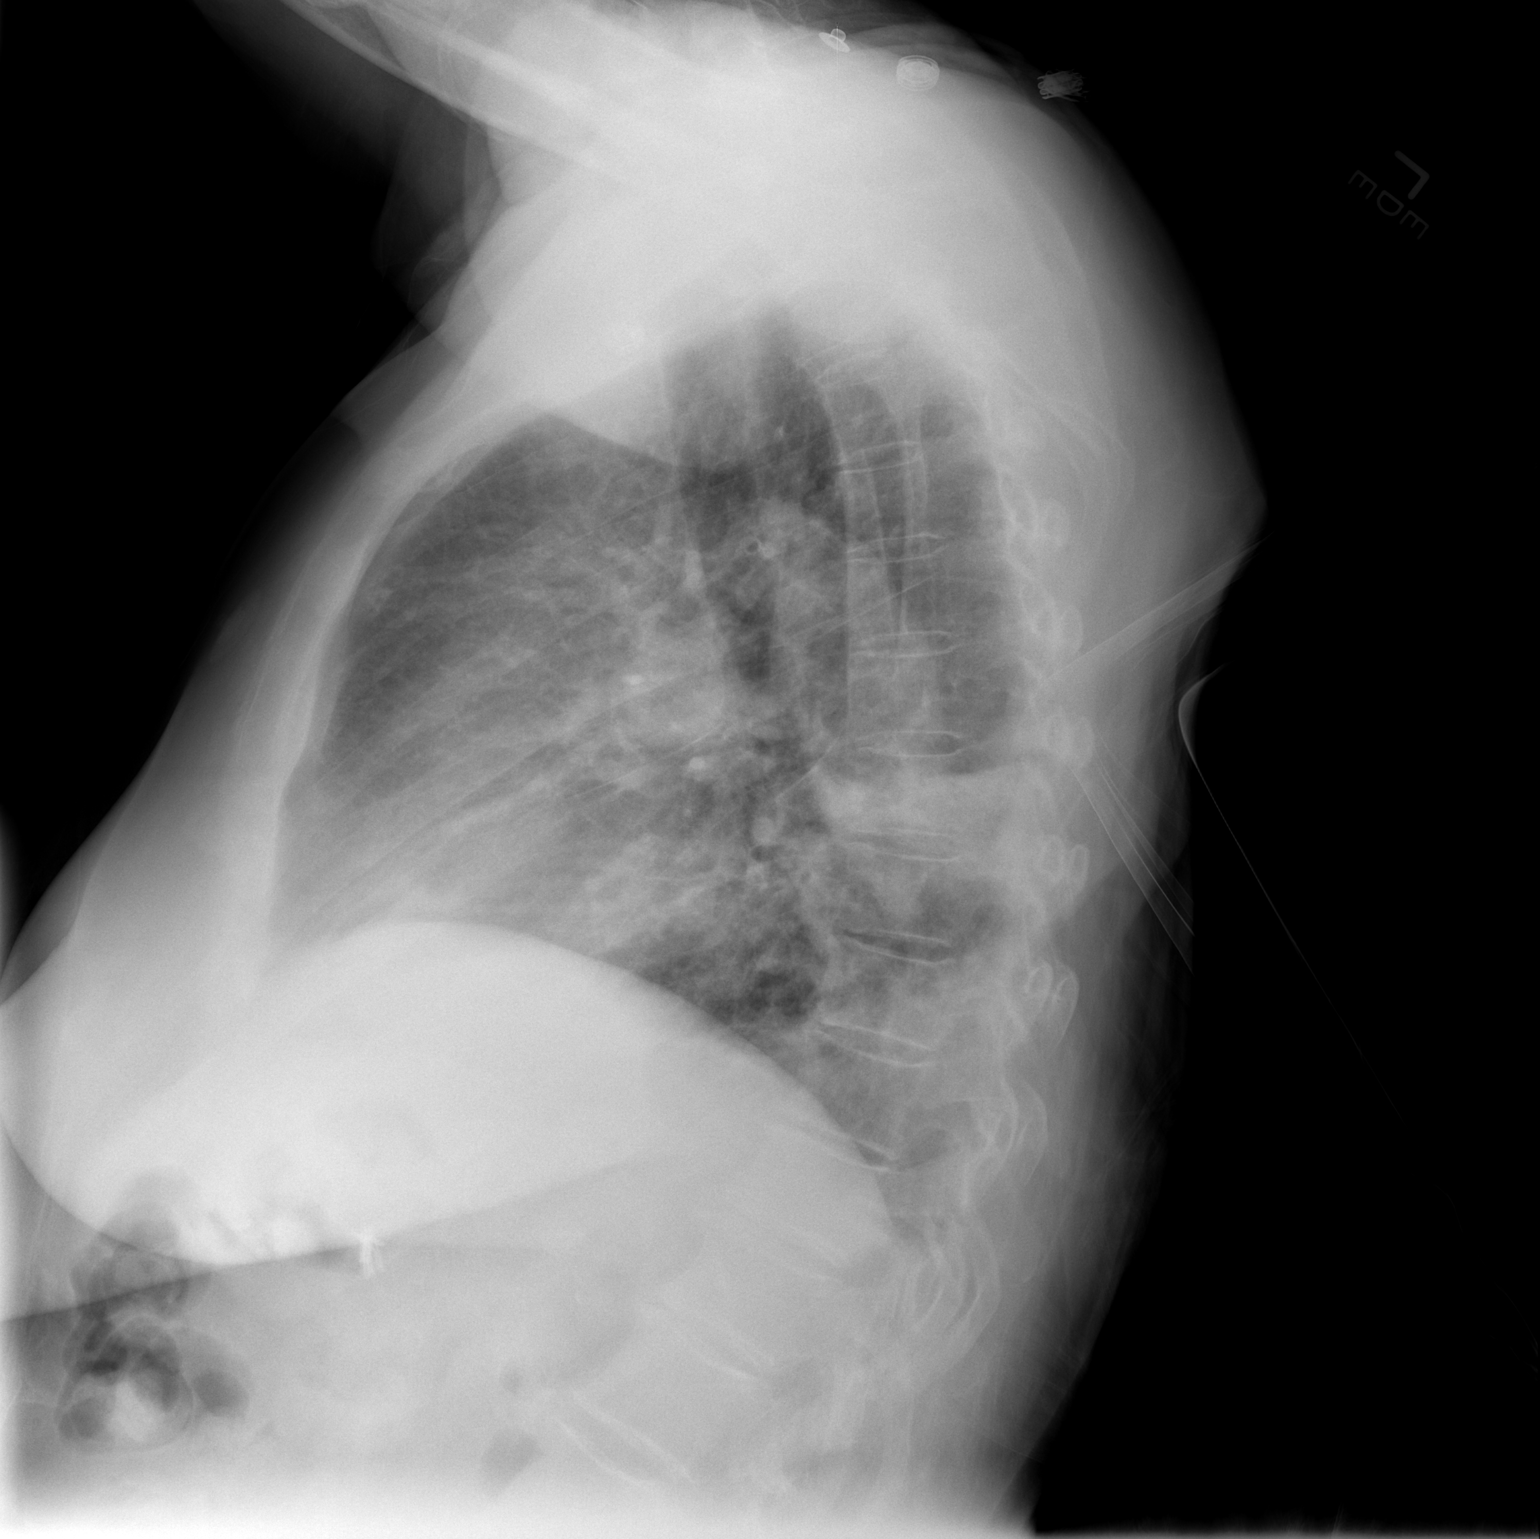

[2 of 2 positions shown; findings below may reference images not displayed]

FINDINGS: Interval development of focal airspace infiltrate in the superior
segment of the left lower lobe. Additionally, there is increased
patchy opacity in the right perihilar region. The cardiac and
mediastinal contours are otherwise unchanged. Stable mild
interstitial prominence and central bronchitic changes. Surgical
clips in the right upper quadrant suggest prior cholecystectomy. No
acute osseous abnormality.
IMPRESSION: 1. New focal airspace consolidation in the superior segment of the
left lower lobe concerning for pneumonia.
2. Nonspecific patchy opacity in the right perihilar region may
reflect atelectasis or a second focus of infiltrate.

## 2015-10-27 ENCOUNTER — Encounter (HOSPITAL_COMMUNITY): Payer: Self-pay | Admitting: Emergency Medicine

## 2015-10-27 ENCOUNTER — Ambulatory Visit (HOSPITAL_COMMUNITY)
Admission: EM | Admit: 2015-10-27 | Discharge: 2015-10-27 | Disposition: A | Discharge status: Home / Self Care | Payer: Medicare PPO | Attending: Family Medicine | Admitting: Family Medicine

## 2015-10-27 DIAGNOSIS — R69 Illness, unspecified: Secondary | ICD-10-CM | POA: Diagnosis not present

## 2015-10-27 DIAGNOSIS — R6889 Other general symptoms and signs: Secondary | ICD-10-CM

## 2015-10-27 NOTE — ED Notes (Addendum)
[facility] with a complaint of right sided lower back pain and right leg pain. The patient stated that she has a hx of kidney infections.

## 2015-10-27 NOTE — Discharge Instructions (Signed)

## 2015-10-28 ENCOUNTER — Ambulatory Visit (HOSPITAL_COMMUNITY)
Admission: EM | Admit: 2015-10-28 | Discharge: 2015-10-28 | Disposition: A | Discharge status: Nursing Home (Includes ICF) | Payer: Medicare PPO | Attending: Family Medicine | Admitting: Family Medicine

## 2015-10-28 ENCOUNTER — Encounter (HOSPITAL_COMMUNITY): Payer: Self-pay | Admitting: Emergency Medicine

## 2015-10-28 ENCOUNTER — Emergency Department (HOSPITAL_COMMUNITY)
Admission: EM | Admit: 2015-10-28 | Discharge: 2015-10-28 | Disposition: A | Discharge status: Home / Self Care | Payer: Medicare PPO | Attending: Emergency Medicine | Admitting: Emergency Medicine

## 2015-10-28 ENCOUNTER — Emergency Department (EMERGENCY_DEPARTMENT_HOSPITAL): Admit: 2015-10-28 | Discharge: 2015-10-28 | Disposition: A | Discharge status: Home / Self Care | Payer: Medicare PPO

## 2015-10-28 ENCOUNTER — Emergency Department (HOSPITAL_COMMUNITY): Payer: Medicare PPO

## 2015-10-28 DIAGNOSIS — F419 Anxiety disorder, unspecified: Secondary | ICD-10-CM | POA: Diagnosis not present

## 2015-10-28 DIAGNOSIS — M79604 Pain in right leg: Secondary | ICD-10-CM | POA: Insufficient documentation

## 2015-10-28 DIAGNOSIS — E039 Hypothyroidism, unspecified: Secondary | ICD-10-CM | POA: Diagnosis not present

## 2015-10-28 DIAGNOSIS — Z87448 Personal history of other diseases of urinary system: Secondary | ICD-10-CM | POA: Insufficient documentation

## 2015-10-28 DIAGNOSIS — M25571 Pain in right ankle and joints of right foot: Secondary | ICD-10-CM | POA: Diagnosis not present

## 2015-10-28 DIAGNOSIS — R350 Frequency of micturition: Secondary | ICD-10-CM | POA: Diagnosis not present

## 2015-10-28 DIAGNOSIS — Z8781 Personal history of (healed) traumatic fracture: Secondary | ICD-10-CM | POA: Diagnosis not present

## 2015-10-28 DIAGNOSIS — R601 Generalized edema: Secondary | ICD-10-CM | POA: Diagnosis not present

## 2015-10-28 DIAGNOSIS — Z8719 Personal history of other diseases of the digestive system: Secondary | ICD-10-CM | POA: Insufficient documentation

## 2015-10-28 DIAGNOSIS — I10 Essential (primary) hypertension: Secondary | ICD-10-CM | POA: Insufficient documentation

## 2015-10-28 DIAGNOSIS — R300 Dysuria: Secondary | ICD-10-CM | POA: Insufficient documentation

## 2015-10-28 DIAGNOSIS — Z8701 Personal history of pneumonia (recurrent): Secondary | ICD-10-CM | POA: Insufficient documentation

## 2015-10-28 DIAGNOSIS — M79605 Pain in left leg: Secondary | ICD-10-CM

## 2015-10-28 DIAGNOSIS — M7989 Other specified soft tissue disorders: Secondary | ICD-10-CM | POA: Diagnosis present

## 2015-10-28 DIAGNOSIS — M25471 Effusion, right ankle: Secondary | ICD-10-CM | POA: Diagnosis not present

## 2015-10-28 DIAGNOSIS — M25473 Effusion, unspecified ankle: Secondary | ICD-10-CM

## 2015-10-28 DIAGNOSIS — M25572 Pain in left ankle and joints of left foot: Secondary | ICD-10-CM | POA: Diagnosis not present

## 2015-10-28 DIAGNOSIS — M25472 Effusion, left ankle: Secondary | ICD-10-CM | POA: Diagnosis not present

## 2015-10-28 DIAGNOSIS — M25579 Pain in unspecified ankle and joints of unspecified foot: Secondary | ICD-10-CM

## 2015-10-28 LAB — URINALYSIS, ROUTINE W REFLEX MICROSCOPIC
BILIRUBIN URINE: NEGATIVE
GLUCOSE, UA: NEGATIVE mg/dL
KETONES UR: NEGATIVE mg/dL
Nitrite: NEGATIVE
PH: 5.5 (ref 5.0–8.0)
Protein, ur: NEGATIVE mg/dL
SPECIFIC GRAVITY, URINE: 1.019 (ref 1.005–1.030)

## 2015-10-28 LAB — CBC
HEMATOCRIT: 38.9 % (ref 36.0–46.0)
HEMOGLOBIN: 12.9 g/dL (ref 12.0–15.0)
MCH: 29.2 pg (ref 26.0–34.0)
MCHC: 33.2 g/dL (ref 30.0–36.0)
MCV: 88 fL (ref 78.0–100.0)
Platelets: 222 10*3/uL (ref 150–400)
RBC: 4.42 MIL/uL (ref 3.87–5.11)
RDW: 13.2 % (ref 11.5–15.5)
WBC: 10.6 10*3/uL — ABNORMAL HIGH (ref 4.0–10.5)

## 2015-10-28 LAB — COMPREHENSIVE METABOLIC PANEL
ALK PHOS: 73 U/L (ref 38–126)
ALT: 26 U/L (ref 14–54)
AST: 33 U/L (ref 15–41)
Albumin: 3.6 g/dL (ref 3.5–5.0)
Anion gap: 7 (ref 5–15)
BILIRUBIN TOTAL: 0.6 mg/dL (ref 0.3–1.2)
BUN: 9 mg/dL (ref 6–20)
CALCIUM: 9.7 mg/dL (ref 8.9–10.3)
CO2: 31 mmol/L (ref 22–32)
Chloride: 103 mmol/L (ref 101–111)
Creatinine, Ser: 0.98 mg/dL (ref 0.44–1.00)
GFR calc Af Amer: >60 mL/min (ref >60)
GFR, EST NON AFRICAN AMERICAN: 52 mL/min — AB (ref >60)
Glucose, Bld: 89 mg/dL (ref 65–99)
POTASSIUM: 3.2 mmol/L — AB (ref 3.5–5.1)
Sodium: 141 mmol/L (ref 135–145)
TOTAL PROTEIN: 6.7 g/dL (ref 6.5–8.1)

## 2015-10-28 LAB — URINE MICROSCOPIC-ADD ON

## 2015-10-28 LAB — I-STAT TROPONIN, ED: TROPONIN I, POC: 0 ng/mL (ref 0.00–0.08)

## 2015-10-28 LAB — BRAIN NATRIURETIC PEPTIDE: B Natriuretic Peptide: 70.3 pg/mL (ref 0.0–100.0)

## 2015-10-28 NOTE — Progress Notes (Signed)
VASCULAR LAB PRELIMINARY  PRELIMINARY  PRELIMINARY  PRELIMINARY  Bilateral lower extremity venous duplex completed.    Preliminary report:  Bilateral:  No evidence of DVT, superficial  thrombosis, or Baker's Cyst in right leg..  Baker's cyst in left pop fossa.  Gave result to patient's nurse. Jenetta Logesami Lorell Thibodaux, RVT, RDMS 10/28/2015, 7:03 PM
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
## 2015-10-28 NOTE — Discharge Instructions (Signed)
We will notify you if the urine culture is positive.  Please se your famioy doctor this week in follow up.  Kee Edema Edema is an abnormal buildup of fluids. It is more common in your legs and thighs. Painless swelling of the feet and ankles is more likely as a person ages. It also is common in looser skin, like around your eyes. HOME CARE   Keep the affected body part above the level of the heart while lying down.  Do not sit still or stand for a long time.  Do not put anything right under your knees when you lie down.  Do not wear tight clothes on your upper legs.  Exercise your legs to help the puffiness (swelling) go down.  Wear elastic bandages or support stockings as told by your doctor.  A low-salt diet may help lessen the puffiness.  Only take medicine as told by your doctor. GET HELP IF:  Treatment is not working.  You have heart, liver, or kidney disease and notice that your skin looks puffy or shiny.  You have puffiness in your legs that does not get better when you raise your legs.  You have sudden weight gain for no reason. GET HELP RIGHT AWAY IF:   You have shortness of breath or chest pain.  You cannot breathe when you lie down.  You have pain, redness, or warmth in the areas that are puffy.  You have heart, liver, or kidney disease and get edema all of a sudden.  You have a fever and your symptoms get worse all of a sudden. MAKE SURE YOU:   Understand these instructions.  Will watch your condition.  Will get help right away if you are not doing well or get worse.   This information is not intended to replace advice given to you by your health care provider. Make sure you discuss any questions you have with your health care provider.   Document Released: 11/03/2007 Document Revised: 05/22/2013 Document Reviewed: 03/09/2013 Elsevier Interactive Patient Education 2016 [employer] p legs elevated.

## 2015-10-28 NOTE — ED Notes (Signed)
[facility] right flank pain with radiation down right leg; pt with leg swelling x 2 weeks and generalized weakness

## 2015-10-28 NOTE — ED Notes (Signed)
Pt being transferred to the ED via our shuttle.  Report was called to the First RN, Shanda BumpsJessica in the ED.  Pt is A&O w/ VSS.  Pt is a high fall risk, but we were unable to put a fall risk bracelet on at this time.
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
## 2015-10-28 NOTE — ED Notes (Signed)
Pt here today with several complaints including edema in BLE and her abdomen, possible UTI symptoms and swelling in her neck and jaw.  Pt was here yesterday but was unable to produce any urine.  She brought us a sample today, but the sample was from 0800 this morning.

## 2015-10-30 LAB — URINE CULTURE

## 2015-11-03 ENCOUNTER — Encounter (HOSPITAL_COMMUNITY): Payer: Self-pay | Admitting: Emergency Medicine

## 2015-11-03 ENCOUNTER — Emergency Department (HOSPITAL_COMMUNITY)
Admission: EM | Admit: 2015-11-03 | Discharge: 2015-11-03 | Disposition: A | Discharge status: Home / Self Care | Payer: Medicare PPO | Attending: Emergency Medicine | Admitting: Emergency Medicine

## 2015-11-03 DIAGNOSIS — Y9289 Other specified places as the place of occurrence of the external cause: Secondary | ICD-10-CM | POA: Diagnosis not present

## 2015-11-03 DIAGNOSIS — E039 Hypothyroidism, unspecified: Secondary | ICD-10-CM | POA: Diagnosis not present

## 2015-11-03 DIAGNOSIS — E876 Hypokalemia: Secondary | ICD-10-CM

## 2015-11-03 DIAGNOSIS — F419 Anxiety disorder, unspecified: Secondary | ICD-10-CM | POA: Diagnosis not present

## 2015-11-03 DIAGNOSIS — Z8781 Personal history of (healed) traumatic fracture: Secondary | ICD-10-CM | POA: Insufficient documentation

## 2015-11-03 DIAGNOSIS — M5430 Sciatica, unspecified side: Secondary | ICD-10-CM | POA: Insufficient documentation

## 2015-11-03 DIAGNOSIS — Z7982 Long term (current) use of aspirin: Secondary | ICD-10-CM | POA: Diagnosis not present

## 2015-11-03 DIAGNOSIS — W1839XA Other fall on same level, initial encounter: Secondary | ICD-10-CM | POA: Insufficient documentation

## 2015-11-03 DIAGNOSIS — Z8719 Personal history of other diseases of the digestive system: Secondary | ICD-10-CM | POA: Insufficient documentation

## 2015-11-03 DIAGNOSIS — Z79899 Other long term (current) drug therapy: Secondary | ICD-10-CM | POA: Insufficient documentation

## 2015-11-03 DIAGNOSIS — Y9389 Activity, other specified: Secondary | ICD-10-CM | POA: Diagnosis not present

## 2015-11-03 DIAGNOSIS — R319 Hematuria, unspecified: Secondary | ICD-10-CM | POA: Diagnosis not present

## 2015-11-03 DIAGNOSIS — R296 Repeated falls: Secondary | ICD-10-CM

## 2015-11-03 DIAGNOSIS — Y998 Other external cause status: Secondary | ICD-10-CM | POA: Diagnosis not present

## 2015-11-03 DIAGNOSIS — Z043 Encounter for examination and observation following other accident: Secondary | ICD-10-CM | POA: Insufficient documentation

## 2015-11-03 DIAGNOSIS — I10 Essential (primary) hypertension: Secondary | ICD-10-CM | POA: Insufficient documentation

## 2015-11-03 DIAGNOSIS — Z8701 Personal history of pneumonia (recurrent): Secondary | ICD-10-CM | POA: Insufficient documentation

## 2015-11-03 LAB — COMPREHENSIVE METABOLIC PANEL
ALK PHOS: 74 U/L (ref 38–126)
ALT: 22 U/L (ref 14–54)
ANION GAP: 5 (ref 5–15)
AST: 23 U/L (ref 15–41)
Albumin: 3.3 g/dL — ABNORMAL LOW (ref 3.5–5.0)
BUN: 10 mg/dL (ref 6–20)
CALCIUM: 9 mg/dL (ref 8.9–10.3)
CO2: 31 mmol/L (ref 22–32)
Chloride: 105 mmol/L (ref 101–111)
Creatinine, Ser: 1 mg/dL (ref 0.44–1.00)
GFR calc non Af Amer: 51 mL/min — ABNORMAL LOW (ref >60)
GFR, EST AFRICAN AMERICAN: 59 mL/min — AB (ref >60)
Glucose, Bld: 100 mg/dL — ABNORMAL HIGH (ref 65–99)
Potassium: 3.1 mmol/L — ABNORMAL LOW (ref 3.5–5.1)
SODIUM: 141 mmol/L (ref 135–145)
Total Bilirubin: 1 mg/dL (ref 0.3–1.2)
Total Protein: 6.2 g/dL — ABNORMAL LOW (ref 6.5–8.1)

## 2015-11-03 LAB — URINALYSIS, ROUTINE W REFLEX MICROSCOPIC
Bilirubin Urine: NEGATIVE
Glucose, UA: NEGATIVE mg/dL
Hgb urine dipstick: NEGATIVE
KETONES UR: NEGATIVE mg/dL
NITRITE: NEGATIVE
PH: 6 (ref 5.0–8.0)
PROTEIN: NEGATIVE mg/dL
Specific Gravity, Urine: 1.011 (ref 1.005–1.030)

## 2015-11-03 LAB — CBC
HCT: 38.2 % (ref 36.0–46.0)
HEMOGLOBIN: 12.6 g/dL (ref 12.0–15.0)
MCH: 28.6 pg (ref 26.0–34.0)
MCHC: 33 g/dL (ref 30.0–36.0)
MCV: 86.8 fL (ref 78.0–100.0)
PLATELETS: 246 10*3/uL (ref 150–400)
RBC: 4.4 MIL/uL (ref 3.87–5.11)
RDW: 13.2 % (ref 11.5–15.5)
WBC: 8.6 10*3/uL (ref 4.0–10.5)

## 2015-11-03 LAB — URINE MICROSCOPIC-ADD ON: RBC / HPF: NONE SEEN RBC/hpf (ref 0–5)

## 2015-11-03 MED ORDER — POTASSIUM CHLORIDE CRYS ER 20 MEQ PO TBCR
40.0000 meq | EXTENDED_RELEASE_TABLET | Freq: Once | ORAL | Status: AC
  Administered 2015-11-03: 40 meq via ORAL
  Filled 2015-11-03: qty 2

## 2015-11-03 MED ORDER — POTASSIUM CHLORIDE CRYS ER 20 MEQ PO TBCR: EXTENDED_RELEASE_TABLET | ORAL | Status: DC

## 2015-11-03 NOTE — ED Notes (Signed)
Patient Alert and oriented X4. Stable and ambulatory. Patient verbalized understanding of the discharge instructions.  Patient belongings were taken by the patient.  

## 2015-11-03 NOTE — ED Notes (Signed)
Patient ambulated in the hall with assistance. Denies any lightheadedness or pain associated with walking. O2 saturation stayed above 95%
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
## 2015-11-03 NOTE — ED Notes (Signed)
Pt presents from home with c/o dark/bloody urine for a few days. Urine much darker today, reports flank pain. While coming into ED, pt fell over doorway treshhold while using walker. Pt fell backwards, reports landing on R and L shoulder and R knee. Alert, disoriented to time. Sister says this is baseline. L eye bruise noted, pt reports multiple falls at home by self.

## 2015-11-03 NOTE — Discharge Instructions (Signed)
It was our pleasure to provide your ER care today - we hope that you feel better.  Rest. Drink plenty of fluids.  Fall precautions.  Use walker, and be very cautious when walking to avoid falls.   From today's lab tests, your potassium level is low (3.1) - eat plenty of fruits and vegetables, take potassium supplement, and follow up with your doctor for recheck in 1 week.  Follow up with your doctor for recheck in the coming week.  Return to ER if worse, new symptoms, fevers, weak/fainting, chest pain, trouble breathing, new or severe pain, other medical emergency.      Fall Prevention in the Home  Falls can cause injuries and can affect people from all age groups. There are many simple things that you can do to make your home safe and to help prevent falls. WHAT CAN I DO ON THE OUTSIDE OF MY HOME?  Regularly repair the edges of walkways and driveways and fix any cracks.  Remove high doorway thresholds.  Trim any shrubbery on the main path into your home.  Use bright outdoor lighting.  Clear walkways of debris and clutter, including tools and rocks.  Regularly check that handrails are securely fastened and in good repair. Both sides of any steps should have handrails.  Install guardrails along the edges of any raised decks or porches.  Have leaves, snow, and ice cleared regularly.  Use sand or salt on walkways during winter months.  In the garage, clean up any spills right away, including grease or oil spills. WHAT CAN I DO IN THE BATHROOM?  Use night lights.  Install grab bars by the toilet and in the tub and shower. Do not use towel bars as grab bars.  Use non-skid mats or decals on the floor of the tub or shower.  If you need to sit down while you are in the shower, use a plastic, non-slip stool.Marland Kitchen  Keep the floor dry. Immediately clean up any water that spills on the floor.  Remove soap buildup in the tub or shower on a regular basis.  Attach bath mats securely  with double-sided non-slip rug tape.  Remove throw rugs and other tripping hazards from the floor. WHAT CAN I DO IN THE BEDROOM?  Use night lights.  Make sure that a bedside light is easy to reach.  Do not use oversized bedding that drapes onto the floor.  Have a firm chair that has side arms to use for getting dressed.  Remove throw rugs and other tripping hazards from the floor. WHAT CAN I DO IN THE KITCHEN?   Clean up any spills right away.  Avoid walking on wet floors.  Place frequently used items in easy-to-reach places.  If you need to reach for something above you, use a sturdy step stool that has a grab bar.  Keep electrical cables out of the way.  Do not use floor polish or wax that makes floors slippery. If you have to use wax, make sure that it is non-skid floor wax.  Remove throw rugs and other tripping hazards from the floor. WHAT CAN I DO IN THE STAIRWAYS?  Do not leave any items on the stairs.  Make sure that there are handrails on both sides of the stairs. Fix handrails that are broken or loose. Make sure that handrails are as long as the stairways.  Check any carpeting to make sure that it is firmly attached to the stairs. Fix any carpet that is loose or  worn.  Avoid having throw rugs at the top or bottom of stairways, or secure the rugs with carpet tape to prevent them from moving.  Make sure that you have a light switch at the top of the stairs and the bottom of the stairs. If you do not have them, have them installed. WHAT ARE SOME OTHER FALL PREVENTION TIPS?  Wear closed-toe shoes that fit well and support your feet. Wear shoes that have rubber soles or low heels.  When you use a stepladder, make sure that it is completely opened and that the sides are firmly locked. Have someone hold the ladder while you are using it. Do not climb a closed stepladder.  Add color or contrast paint or tape to grab bars and handrails in your home. Place contrasting  color strips on the first and last steps.  Use mobility aids as needed, such as canes, walkers, scooters, and crutches.  Turn on lights if it is dark. Replace any light bulbs that burn out.  Set up furniture so that there are clear paths. Keep the furniture in the same spot.  Fix any uneven floor surfaces.  Choose a carpet design that does not hide the edge of steps of a stairway.  Be aware of any and all pets.  Review your medicines with your healthcare provider. Some medicines can cause dizziness or changes in blood pressure, which increase your risk of falling. Talk with your health care provider about other ways that you can decrease your risk of falls. This may include working with a physical therapist or trainer to improve your strength, balance, and endurance.   This information is not intended to replace advice given to you by your health care provider. Make sure you discuss any questions you have with your health care provider.   Document Released: 05/07/2002 Document Revised: 10/01/2014 Document Reviewed: 06/21/2014 Elsevier Interactive Patient Education 2016 [employer].     Hypokalemia Hypokalemia means that the amount of potassium in the blood is lower than normal.Potassium is a chemical, called an electrolyte, that helps regulate the amount of fluid in the body. It also stimulates muscle contraction and helps nerves function properly.Most of the body's potassium is inside of cells, and only a very small amount is in the blood. Because the amount in the blood is so small, minor changes can be life-threatening. CAUSES  Antibiotics.  Diarrhea or vomiting.  Using laxatives too much, which can cause diarrhea.  Chronic kidney disease.  Water pills (diuretics).  Eating disorders (bulimia).  Low magnesium level.  Sweating a lot. SIGNS AND SYMPTOMS  Weakness.  Constipation.  Fatigue.  Muscle cramps.  Mental confusion.  Skipped heartbeats or irregular  heartbeat (palpitations).  Tingling or numbness. DIAGNOSIS  Your health care provider can diagnose hypokalemia with blood tests. In addition to checking your potassium level, your health care provider may also check other lab tests. TREATMENT Hypokalemia can be treated with potassium supplements taken by mouth or adjustments in your current medicines. If your potassium level is very low, you may need to get potassium through a vein (IV) and be monitored in the hospital. A diet high in potassium is also helpful. Foods high in potassium are:  Nuts, such as peanuts and pistachios.  Seeds, such as sunflower seeds and pumpkin seeds.  Peas, lentils, and lima beans.  Whole grain and bran cereals and breads.  Fresh fruit and vegetables, such as apricots, avocado, bananas, cantaloupe, kiwi, oranges, tomatoes, asparagus, and potatoes.  Orange and tomato  juices.  Red meats.  Fruit yogurt. HOME CARE INSTRUCTIONS  Take all medicines as prescribed by your health care provider.  Maintain a healthy diet by including nutritious food, such as fruits, vegetables, nuts, whole grains, and lean meats.  If you are taking a laxative, be sure to follow the directions on the label. SEEK MEDICAL CARE IF:  Your weakness gets worse.  You feel your heart pounding or racing.  You are vomiting or having diarrhea.  You are diabetic and having trouble keeping your blood glucose in the normal range. SEEK IMMEDIATE MEDICAL CARE IF:  You have chest pain, shortness of breath, or dizziness.  You are vomiting or having diarrhea for more than 2 days.  You faint. MAKE SURE YOU:   Understand these instructions.  Will watch your condition.  Will get help right away if you are not doing well or get worse.   This information is not intended to replace advice given to you by your health care provider. Make sure you discuss any questions you have with your health care provider.   Document Released:  05/17/2005 Document Revised: 06/07/2014 Document Reviewed: 11/17/2012 Elsevier Interactive Patient Education [employer] Elsevier Inc.

## 2017-04-20 IMAGING — DX DG CHEST 2V
2 series · 2 of 2 positions shown · non-contrast
Comparison: 07/11/2013

CLINICAL DATA: Shortness of breath. Lower extremity edema.
Congestive heart failure.

EXAM:
CHEST  2 VIEW

[chest pa]
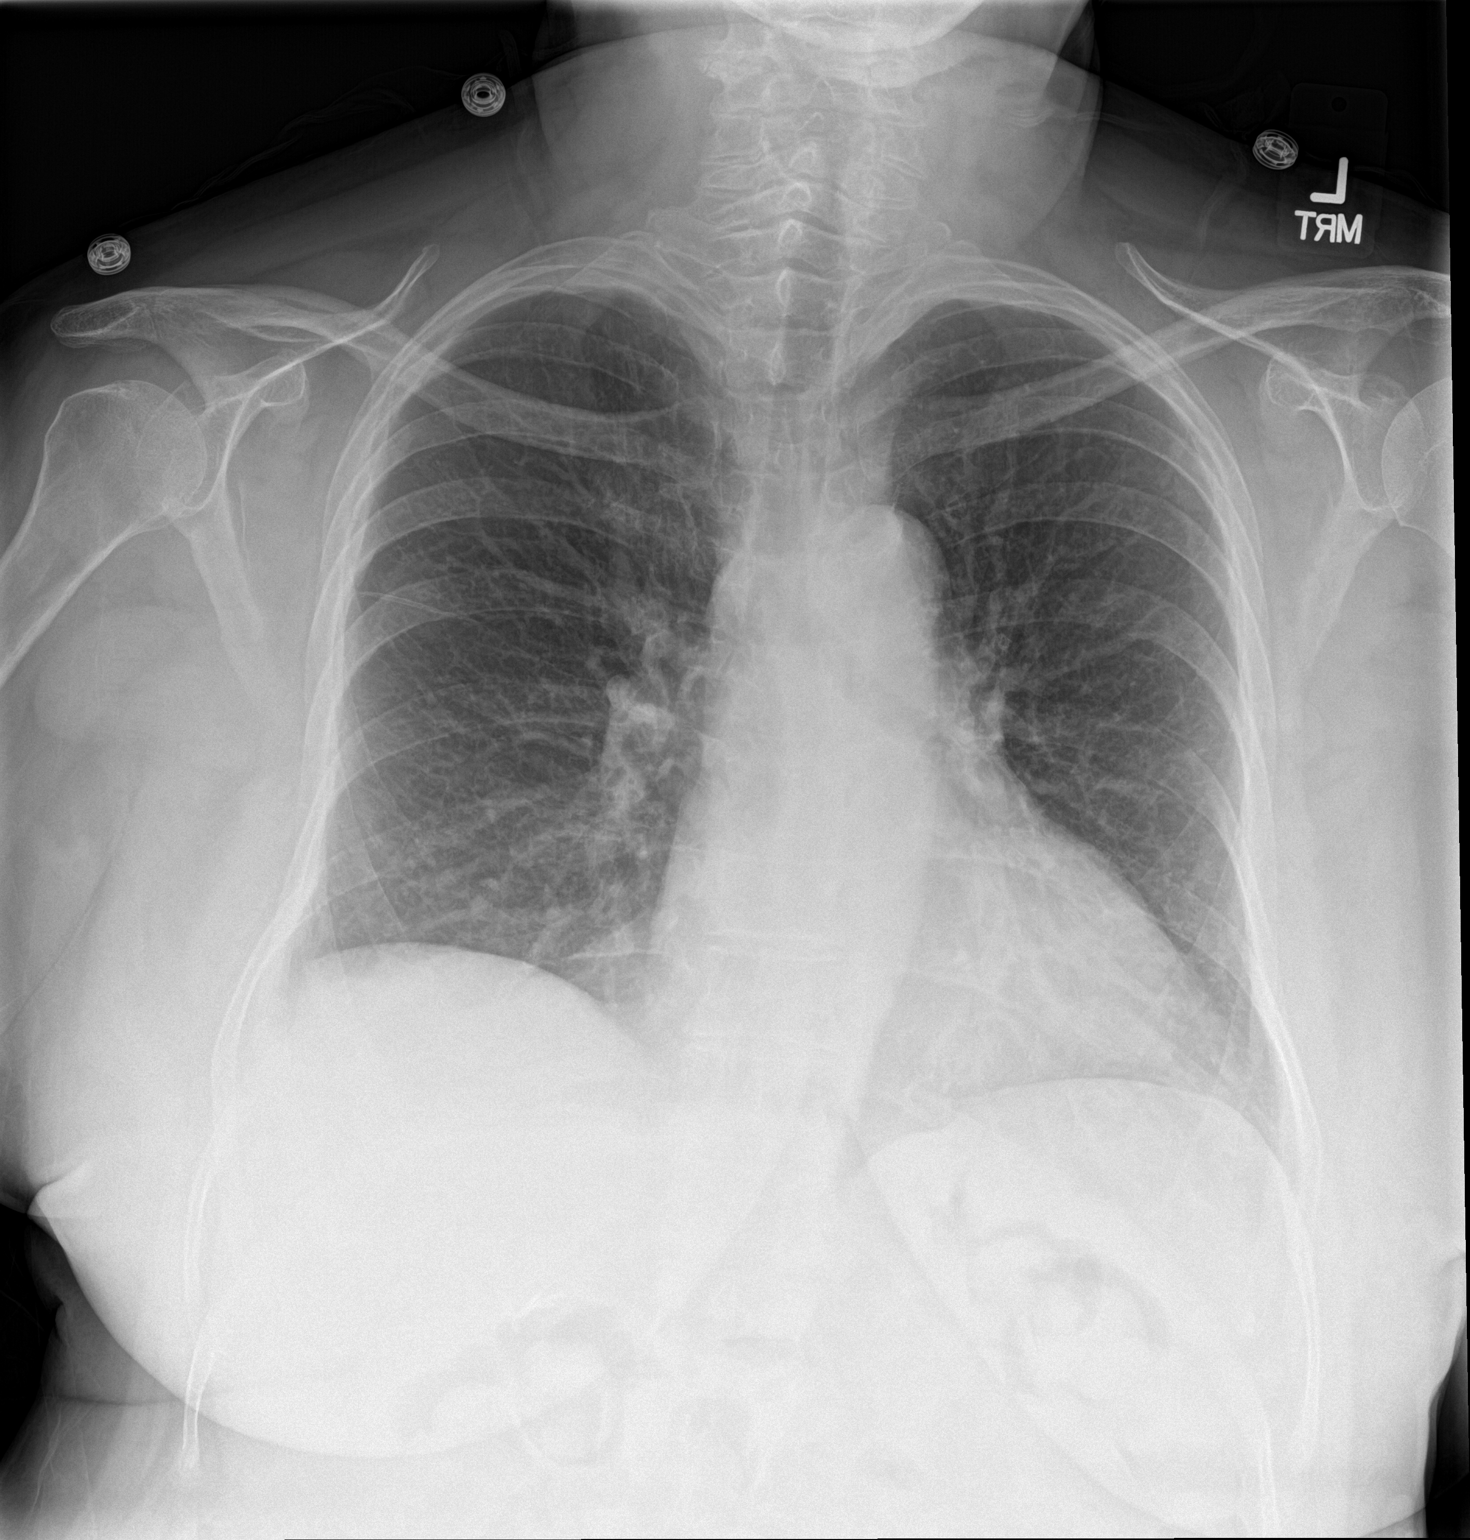

[chest lat]
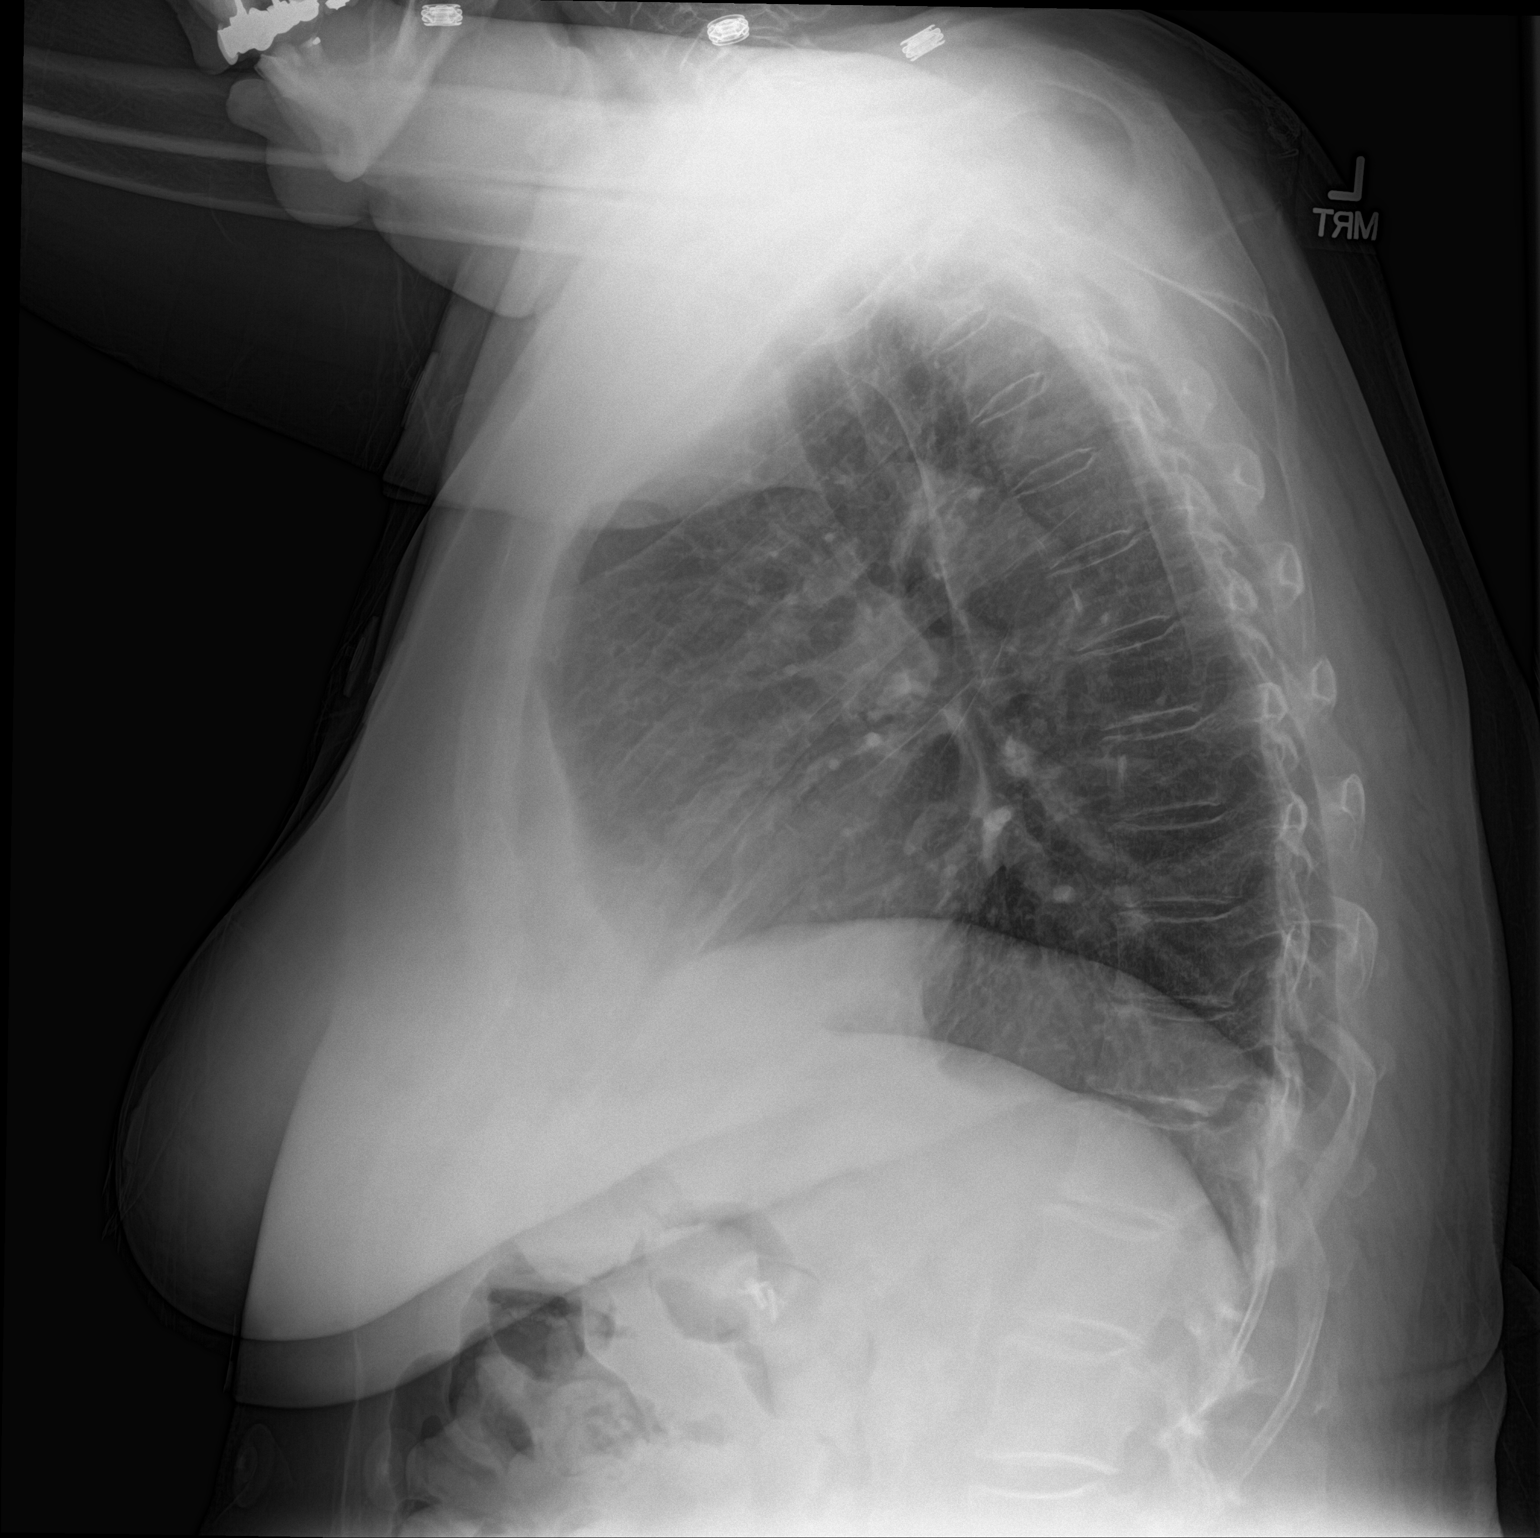

[2 of 2 positions shown; findings below may reference images not displayed]

FINDINGS: The heart size and mediastinal contours are within normal limits.
Both lungs are clear. Previously seen bilateral perihilar
infiltrates have resolved. No evidence of pleural effusion. The
visualized skeletal structures are unremarkable.
IMPRESSION: No active cardiopulmonary disease.

## 2024-05-23 ENCOUNTER — Emergency Department (HOSPITAL_COMMUNITY)
Admission: EM | Admit: 2024-05-23 | Discharge: 2024-05-23 | Disposition: A | Discharge status: Home / Self Care | Attending: Emergency Medicine | Admitting: Emergency Medicine

## 2024-05-23 ENCOUNTER — Emergency Department (HOSPITAL_COMMUNITY)

## 2024-05-23 ENCOUNTER — Other Ambulatory Visit: Payer: Self-pay

## 2024-05-23 ENCOUNTER — Encounter (HOSPITAL_COMMUNITY): Payer: Self-pay | Admitting: Emergency Medicine

## 2024-05-23 DIAGNOSIS — M48061 Spinal stenosis, lumbar region without neurogenic claudication: Secondary | ICD-10-CM | POA: Insufficient documentation

## 2024-05-23 DIAGNOSIS — W19XXXA Unspecified fall, initial encounter: Secondary | ICD-10-CM | POA: Diagnosis not present

## 2024-05-23 DIAGNOSIS — R197 Diarrhea, unspecified: Secondary | ICD-10-CM | POA: Insufficient documentation

## 2024-05-23 DIAGNOSIS — Z7982 Long term (current) use of aspirin: Secondary | ICD-10-CM | POA: Diagnosis not present

## 2024-05-23 DIAGNOSIS — R531 Weakness: Secondary | ICD-10-CM | POA: Diagnosis not present

## 2024-05-23 DIAGNOSIS — I10 Essential (primary) hypertension: Secondary | ICD-10-CM | POA: Insufficient documentation

## 2024-05-23 DIAGNOSIS — R300 Dysuria: Secondary | ICD-10-CM | POA: Diagnosis not present

## 2024-05-23 DIAGNOSIS — Z79899 Other long term (current) drug therapy: Secondary | ICD-10-CM | POA: Insufficient documentation

## 2024-05-23 DIAGNOSIS — M549 Dorsalgia, unspecified: Secondary | ICD-10-CM | POA: Diagnosis present

## 2024-05-23 DIAGNOSIS — M51362 Other intervertebral disc degeneration, lumbar region with discogenic back pain and lower extremity pain: Secondary | ICD-10-CM

## 2024-05-23 LAB — CBC WITH DIFFERENTIAL/PLATELET
Abs Immature Granulocytes: 0.02 K/uL (ref 0.00–0.07)
Basophils Absolute: 0.1 K/uL (ref 0.0–0.1)
Basophils Relative: 0 %
Eosinophils Absolute: 0.1 K/uL (ref 0.0–0.5)
Eosinophils Relative: 1 %
HCT: 42.6 % (ref 36.0–46.0)
Hemoglobin: 14.3 g/dL (ref 12.0–15.0)
Immature Granulocytes: 0 %
Lymphocytes Relative: 72 %
Lymphs Abs: 8.9 K/uL — ABNORMAL HIGH (ref 0.7–4.0)
MCH: 31 pg (ref 26.0–34.0)
MCHC: 33.6 g/dL (ref 30.0–36.0)
MCV: 92.2 fL (ref 80.0–100.0)
Monocytes Absolute: 0.7 K/uL (ref 0.1–1.0)
Monocytes Relative: 6 %
Neutro Abs: 2.6 K/uL (ref 1.7–7.7)
Neutrophils Relative %: 21 %
Platelets: 190 K/uL (ref 150–400)
RBC: 4.62 MIL/uL (ref 3.87–5.11)
RDW: 13.3 % (ref 11.5–15.5)
Smear Review: NORMAL
WBC: 12.4 K/uL — ABNORMAL HIGH (ref 4.0–10.5)
nRBC: 0 % (ref 0.0–0.2)

## 2024-05-23 LAB — URINALYSIS, ROUTINE W REFLEX MICROSCOPIC
Bilirubin Urine: NEGATIVE
Glucose, UA: NEGATIVE mg/dL
Hgb urine dipstick: NEGATIVE
Ketones, ur: NEGATIVE mg/dL
Leukocytes,Ua: NEGATIVE
Nitrite: NEGATIVE
Protein, ur: NEGATIVE mg/dL
Specific Gravity, Urine: 1.014 (ref 1.005–1.030)
pH: 5 (ref 5.0–8.0)

## 2024-05-23 LAB — COMPREHENSIVE METABOLIC PANEL WITH GFR
ALT: 16 U/L (ref 0–44)
AST: 22 U/L (ref 15–41)
Albumin: 4.1 g/dL (ref 3.5–5.0)
Alkaline Phosphatase: 85 U/L (ref 38–126)
Anion gap: 11 (ref 5–15)
BUN: 14 mg/dL (ref 8–23)
CO2: 27 mmol/L (ref 22–32)
Calcium: 10 mg/dL (ref 8.9–10.3)
Chloride: 101 mmol/L (ref 98–111)
Creatinine, Ser: 1.1 mg/dL — ABNORMAL HIGH (ref 0.44–1.00)
GFR, Estimated: 47 mL/min — ABNORMAL LOW
Glucose, Bld: 92 mg/dL (ref 70–99)
Potassium: 3.9 mmol/L (ref 3.5–5.1)
Sodium: 138 mmol/L (ref 135–145)
Total Bilirubin: 0.5 mg/dL (ref 0.0–1.2)
Total Protein: 7.4 g/dL (ref 6.5–8.1)

## 2024-05-23 LAB — RESP PANEL BY RT-PCR (RSV, FLU A&B, COVID)  RVPGX2
Influenza A by PCR: NEGATIVE
Influenza B by PCR: NEGATIVE
Resp Syncytial Virus by PCR: NEGATIVE
SARS Coronavirus 2 by RT PCR: NEGATIVE

## 2024-05-23 LAB — CK: Total CK: 48 U/L (ref 38–234)

## 2024-05-23 MED ORDER — SODIUM CHLORIDE 0.9 % IV BOLUS
500.0000 mL | Freq: Once | INTRAVENOUS | Status: AC
  Administered 2024-05-23: 500 mL via INTRAVENOUS

## 2024-05-23 MED ORDER — HYDROCODONE-ACETAMINOPHEN 5-325 MG PO TABS: 1.0000 | ORAL_TABLET | Freq: Four times a day (QID) | ORAL | 0 refills | Status: DC | PRN

## 2024-05-23 MED ORDER — HYDROCODONE-ACETAMINOPHEN 5-325 MG PO TABS
1.0000 | ORAL_TABLET | Freq: Once | ORAL | Status: AC
  Administered 2024-05-23: 1 via ORAL
  Filled 2024-05-23: qty 1

## 2024-05-23 NOTE — ED Notes (Signed)
 Pt states she is incontinent and cannot provide a UA at this time. She has a cup in hand

## 2024-05-23 NOTE — ED Provider Triage Note (Signed)
 Emergency Medicine Provider Triage Evaluation Note  [name] , a 89 y.o. female  was evaluated in triage.  Pt complains of recurrent falls. Crawled down the hallway today. Reports h/o spinal stenosis. Reports generalized weakness, diarrhea, and decreased PO intake. Body just doesn't have any strength. No nausea/vomiting. No head trauma/LOC. Reports back pain. Hasn't walked since Saturday.   Review of Systems  As above.  Physical Exam  BP (!) 141/57   Pulse 61   Temp 97.6 F (36.4 C) (Oral)   Resp 16   SpO2 97%  Gen:   Awake, no distress   Resp:  Normal effort  MSK:   Moves extremities without difficulty. L knee swelling. No deformity.  Other:  Intact speech, NCAT. +Midline C, T, and L spine TTP. Intact distal pulses.   Medical Decision Making  Medically screening exam initiated at 1:09 PM.  Appropriate orders placed.  [name] was informed that the remainder of the evaluation will be completed by another provider, this initial triage assessment does not replace that evaluation, and the importance of remaining in the ED until their evaluation is complete.  Labs/imaging ordered. Patient will be moved to treatment space when one becomes available. HDS.   Franklyn Sid SAILOR, MD 05/23/24 1314

## 2024-05-23 NOTE — Discharge Instructions (Signed)
 As we discussed, you have no fractures but you do have arthritis in your back  I have prescribed Vicodin for pain  See your doctor for follow-up  Return to ER if you have another fall or severe back pain or trouble walking

## 2024-05-23 NOTE — ED Triage Notes (Addendum)
 Pt BIb GCESMS for chronic Pain in hips knees and ankles bilaterally. Pain increased yesterday after a fall. States she fell to her knees and used her L. Arm to scooch along the floor to bed and get back into bedl  So knees and L. Arm in particular are painful. 8/10 sitting still. .   142/82, HR 62 RR 16, 98%

## 2024-05-23 NOTE — ED Notes (Signed)
 Patient transported to X-ray

## 2024-05-23 NOTE — ED Provider Notes (Signed)
 " [place] [facility] Provider Note   CSN: [id number] Arrival date & time: 05/23/24  1236     Patient presents with: No chief complaint on file.   [name] is a 89 y.o. female history of chronic pain, hypertension, recurrent falls here presenting with another fall.  Patient states that she had a fall today.  She states that she has a hard time walking afterwards and able to crawl.  She notified her sister who called EMS to bring her here.  Patient also has some dysuria as well.  Patient has a history of recurrent UTIs   The history is provided by the patient.       Prior to Admission medications  Medication Sig Start Date End Date Taking? Authorizing Provider  ALPRAZolam  (XANAX ) 1 MG tablet Take 1 mg by mouth 3 (three) times daily as needed for anxiety.     [provider]  aspirin EC 81 MG tablet Take 162 mg by mouth daily.    [provider]  estradiol  (ALORA ) 0.05 MG/24HR patch Place 1 patch onto the skin 2 (two) times a week.    [provider]  hydrochlorothiazide (MICROZIDE) 12.5 MG capsule Take 12.5 mg by mouth every morning. 09/24/15   [provider]  levothyroxine  (SYNTHROID , LEVOTHROID) 75 MCG tablet Take 75 mcg by mouth daily before breakfast.    [provider]  metoprolol  succinate (TOPROL -XL) 50 MG 24 hr tablet Take 50 mg by mouth 2 (two) times daily. Take with or immediately following a meal.    [provider]  Multiple Vitamins-Minerals (MULTIVITAMIN GUMMIES ADULTS) CHEW Chew 2 tablets by mouth daily.    [provider]  potassium chloride  SA (K-DUR,KLOR-CON ) 20 MEQ tablet One (1) po bid x 2 days, then one (1) po once a day 11/03/15   Steinl, Kevin, MD  traMADol  (ULTRAM ) 50 MG tablet Take by mouth every 6 (six) hours as needed for moderate pain.     [provider]    Allergies: Codeine and Phenergan [promethazine hcl]    Review of Systems  Genitourinary:   Positive for difficulty urinating.  Musculoskeletal:        Back pain  All other systems reviewed and are negative.   Updated Vital Signs BP (!) 153/61 (BP Location: Left Arm)   Pulse 65   Temp 97.8 F (36.6 C) (Oral)   Resp 17   SpO2 97%   Physical Exam Vitals and nursing note reviewed.  Constitutional:      Comments: Chronically ill and slightly uncomfortable  HENT:     Head: Normocephalic and atraumatic.     Nose: Nose normal.     Mouth/Throat:     Mouth: Mucous membranes are moist.  Eyes:     Extraocular Movements: Extraocular movements intact.     Pupils: Pupils are equal, round, and reactive to light.  Cardiovascular:     Rate and Rhythm: Normal rate and regular rhythm.     Pulses: Normal pulses.     Heart sounds: Normal heart sounds.  Pulmonary:     Effort: Pulmonary effort is normal.     Breath sounds: Normal breath sounds.  Abdominal:     General: Abdomen is flat.     Palpations: Abdomen is soft.  Musculoskeletal:     Cervical back: Normal range of motion and neck supple.     Comments: Mild lower lumbar tenderness.  No obvious hip deformity  Skin:    General:  Skin is warm.     Capillary Refill: Capillary refill takes less than 2 seconds.  Neurological:     General: No focal deficit present.     Mental Status: She is alert and oriented to person, place, and time.     Comments: Normal strength bilateral lower extremities  Psychiatric:        Mood and Affect: Mood normal.        Behavior: Behavior normal.     (all labs ordered are listed, but only abnormal results are displayed) Labs Reviewed  CBC WITH DIFFERENTIAL/PLATELET - Abnormal; Notable for the following components:      Result Value   WBC 12.4 (*)    Lymphs Abs 8.9 (*)    All other components within normal limits  COMPREHENSIVE METABOLIC PANEL WITH GFR - Abnormal; Notable for the following components:   Creatinine, Ser 1.10 (*)    GFR, Estimated 47 (*)    All other components within normal  limits  RESP PANEL BY RT-PCR (RSV, FLU A&B, COVID)  RVPGX2  URINE CULTURE  CK  URINALYSIS, ROUTINE W REFLEX MICROSCOPIC  PATHOLOGIST SMEAR REVIEW    EKG: EKG Interpretation Date/Time:  Wednesday May 23 2024 13:32:48 EST Ventricular Rate:  64 PR Interval:  230 QRS Duration:  78 QT Interval:  392 QTC Calculation: 404 R Axis:   -36  Text Interpretation: Sinus rhythm with 1st degree A-V block Left axis deviation Left ventricular hypertrophy with repolarization abnormality ( R in aVL ) Cannot rule out Septal infarct , age undetermined Abnormal ECG When compared with ECG of 28-Oct-2015 17:51, PREVIOUS ECG IS PRESENT Confirmed by Patt Alm DEL [phone]) on 05/23/2024 4:24:42 PM  Radiology: CT Lumbar Spine Wo Contrast Result Date: 05/23/2024 EXAM: CT OF THE LUMBAR SPINE WITHOUT CONTRAST 05/23/2024 02:08:15 PM TECHNIQUE: CT of the lumbar spine was performed without the administration of intravenous contrast. Multiplanar reformatted images are provided for review. Automated exposure control, iterative reconstruction, and/or weight based adjustment of the mA/kV was utilized to reduce the radiation dose to as low as reasonably achievable. COMPARISON: None available. CLINICAL HISTORY: Back trauma, no prior imaging (Age >= 16y). FINDINGS: BONES AND ALIGNMENT: 5 lumbar type vertebrae. Trace anterolisthesis of L4 on L5. No acute fracture or suspicious bone lesion. DEGENERATIVE CHANGES: Overall mild for age lumbar disc degeneration. Asymmetrically advanced facet arthrosis on the right at L4-L5 and L5-S1. Previous left laminectomy and partial facetectomy at L4-L5. Moderate neural foraminal stenosis on the left at L4-L5 and on the right at L5-S1. No evidence of high grade spinal canal stenosis. SOFT TISSUES: Nonobstructing bilateral renal calculi, with the largest measuring 4 mm on the right. Aortic atherosclerosis. Colonic diverticulosis. IMPRESSION: 1. No acute osseous abnormality in the lumbar spine. 2.  Multilevel disc and facet degeneration without evidence of high-grade spinal stenosis. 3. Nonobstructing bilateral nephrolithiasis. Electronically signed by: Dasie Hamburg MD 05/23/2024 03:01 PM EST RP Workstation: [id number]   CT Thoracic Spine Wo Contrast Result Date: 05/23/2024 EXAM: CT THORACIC SPINE WITHOUT CONTRAST 05/23/2024 02:08:15 PM TECHNIQUE: CT of the thoracic spine was performed without the administration of intravenous contrast. Multiplanar reformatted images are provided for review. Automated exposure control, iterative reconstruction, and/or weight based adjustment of the mA/kV was utilized to reduce the radiation dose to as low as reasonably achievable. COMPARISON: None available. CLINICAL HISTORY: Back trauma, no prior imaging (Age >= 16y) FINDINGS: BONES AND ALIGNMENT: Trace anterolisthesis of T1 on T2 and trace retrolisthesis of T12 on L1. No acute fracture or suspicious bone  lesion. DEGENERATIVE CHANGES: Overall mild for age thoracic disc degeneration. No evidence of high grade spinal canal stenosis. SOFT TISSUES: Aortic and coronary atherosclerosis. Nonobstructing right renal calculus. No acute abnormality. IMPRESSION: 1. No acute osseous abnormality in the thoracic spine. Electronically signed by: Dasie Hamburg MD 05/23/2024 02:57 PM EST RP Workstation: [id number]   CT Cervical Spine Wo Contrast Result Date: 05/23/2024 EXAM: CT CERVICAL SPINE WITHOUT CONTRAST 05/23/2024 02:08:15 PM TECHNIQUE: CT of the cervical spine was performed without the administration of intravenous contrast. Multiplanar reformatted images are provided for review. Automated exposure control, iterative reconstruction, and/or weight based adjustment of the mA/kV was utilized to reduce the radiation dose to as low as reasonably achievable. COMPARISON: CT cervical spine 05/27/2013. CLINICAL HISTORY: Neck trauma (Age >= 65y). FINDINGS: BONES AND ALIGNMENT: Cervical spine straightening. Trace anterolisthesis of C3 on C4  and C7 on T1, degenerative in appearance. No acute fracture or suspicious lesion. DEGENERATIVE CHANGES: Disc degeneration in the mid and lower cervical spine, with disc space narrowing and degenerative endplate changes being most advanced at C4-C5 and C5-C6. Upper cervical facet arthrosis, most severe on the right at C3-C4 with associated moderate right neural foraminal stenosis. Moderate neural foraminal stenosis bilaterally at C4-C5 and C5-C6 due to uncovertebral spurring. Suspected mild multilevel spinal stenosis. SOFT TISSUES: No prevertebral soft tissue swelling. IMPRESSION: 1. No acute cervical spine fracture or traumatic malalignment. Electronically signed by: Dasie Hamburg MD 05/23/2024 02:42 PM EST RP Workstation: [id number]   DG Chest 1 View Result Date: 05/23/2024 EXAM: 1 VIEW(S) XRAY OF THE CHEST 05/23/2024 01:57:00 PM COMPARISON: None available. CLINICAL HISTORY: [id number] Fall [id number] FINDINGS: LUNGS AND PLEURA: Low lung volumes. No focal pulmonary opacity. No pleural effusion. No pneumothorax. HEART AND MEDIASTINUM: Mild cardiomegaly. Aortic arch atherosclerosis. BONES AND SOFT TISSUES: Cholecystectomy clips noted. No acute osseous abnormality. IMPRESSION: 1. No acute airspace disease. Mild cardiomegaly. Electronically signed by: Michaeline Blanch MD 05/23/2024 02:09 PM EST RP Workstation: [id number]   DG Knee 2 Views Left Result Date: 05/23/2024 EXAM: 1 OR 2 VIEW(S) XRAY OF THE LEFT KNEE 05/23/2024 01:57:00 PM COMPARISON: None available. CLINICAL HISTORY: falls FINDINGS: BONES AND JOINTS: No acute fracture. No malalignment. Small pedunculated osteochondroma from proximal medial tibial metaphysis. Moderate joint space narrowing, subchondral sclerosis and degenerative osteophytosis. Diffuse osteopenia. Possible trace joint effusion. SOFT TISSUES: Vascular calcifications. IMPRESSION: 1. No acute osseous abnormality. 2. Moderate knee osteoarthrosis. Electronically signed by: Michaeline Blanch MD 05/23/2024 02:06  PM EST RP Workstation: [id number]   DG Pelvis 1-2 Views Result Date: 05/23/2024 EXAM: 1 OR 2 VIEW(S) XRAY OF THE PELVIS 05/23/2024 01:57:00 PM COMPARISON: 05/27/2013 CLINICAL HISTORY: falls FINDINGS: BONES AND JOINTS: Degenerative changes of the lower lumbar spine. Osteopenia. No acute fracture. No malalignment. SOFT TISSUES: Surgical clips in lower abdomen. IMPRESSION: 1. No acute osseous abnormality. Electronically signed by: Michaeline Blanch MD 05/23/2024 02:04 PM EST RP Workstation: [id number]     Procedures   Medications Ordered in the ED  HYDROcodone -acetaminophen  (NORCO/VICODIN) 5-325 MG per tablet 1 tablet (1 tablet Oral Given 05/23/24 1812)  sodium chloride  0.9 % bolus 500 mL (0 mLs Intravenous Stopped 05/23/24 1909)                                    Medical Decision Making [name] is a 89 y.o. female here presenting with fall with back pain.  Consider lumbar fracture after fall.  Also consider UTI.  Plan to get CT lumbar  spine and urinalysis and labs.  Will give pain medicine and reassess  8:33 PM I reviewed patient labs and independently interpreted imaging studies.  CT showed no fractures.  Patient also does not have a UTI.  Patient is feeling better after pain medicine.  Patient is stable for discharge.  She has walker at home.   Problems Addressed: Fall, initial encounter: acute illness or injury Spinal stenosis of lumbar region, unspecified whether neurogenic claudication present: acute illness or injury  Amount and/or Complexity of Data Reviewed Labs: ordered. Decision-making details documented in ED Course.  Risk Prescription drug management.     Final diagnoses:  None    ED Discharge Orders     None          Patt Alm Macho, MD 05/23/24 2034  "

## 2024-05-23 NOTE — ED Notes (Addendum)
 Pt ambulated with a [name] and with a 2 person assist. Pt was able to walk about 5 feet and stated my legs are getting too weak I need to sit back down

## 2024-05-25 LAB — URINE CULTURE: Culture: NO GROWTH

## 2024-05-26 ENCOUNTER — Emergency Department (HOSPITAL_COMMUNITY)

## 2024-05-26 ENCOUNTER — Encounter (HOSPITAL_COMMUNITY): Payer: Self-pay

## 2024-05-26 ENCOUNTER — Inpatient Hospital Stay (HOSPITAL_COMMUNITY)
Admission: EM | Admit: 2024-05-26 | Discharge: 2024-06-11 | DRG: 064 | Disposition: A | Discharge status: Skilled Nursing Facility | Attending: Student | Admitting: Student

## 2024-05-26 ENCOUNTER — Other Ambulatory Visit: Payer: Self-pay

## 2024-05-26 DIAGNOSIS — G43909 Migraine, unspecified, not intractable, without status migrainosus: Secondary | ICD-10-CM | POA: Diagnosis present

## 2024-05-26 DIAGNOSIS — R54 Age-related physical debility: Secondary | ICD-10-CM | POA: Diagnosis present

## 2024-05-26 DIAGNOSIS — Z885 Allergy status to narcotic agent status: Secondary | ICD-10-CM

## 2024-05-26 DIAGNOSIS — R001 Bradycardia, unspecified: Secondary | ICD-10-CM | POA: Diagnosis present

## 2024-05-26 DIAGNOSIS — Z1152 Encounter for screening for COVID-19: Secondary | ICD-10-CM

## 2024-05-26 DIAGNOSIS — G8194 Hemiplegia, unspecified affecting left nondominant side: Secondary | ICD-10-CM | POA: Diagnosis present

## 2024-05-26 DIAGNOSIS — Z638 Other specified problems related to primary support group: Secondary | ICD-10-CM

## 2024-05-26 DIAGNOSIS — R531 Weakness: Principal | ICD-10-CM

## 2024-05-26 DIAGNOSIS — C7931 Secondary malignant neoplasm of brain: Secondary | ICD-10-CM | POA: Diagnosis present

## 2024-05-26 DIAGNOSIS — E663 Overweight: Secondary | ICD-10-CM | POA: Diagnosis present

## 2024-05-26 DIAGNOSIS — F411 Generalized anxiety disorder: Secondary | ICD-10-CM | POA: Diagnosis present

## 2024-05-26 DIAGNOSIS — Z888 Allergy status to other drugs, medicaments and biological substances status: Secondary | ICD-10-CM

## 2024-05-26 DIAGNOSIS — G936 Cerebral edema: Secondary | ICD-10-CM | POA: Diagnosis present

## 2024-05-26 DIAGNOSIS — F410 Panic disorder [episodic paroxysmal anxiety] without agoraphobia: Secondary | ICD-10-CM | POA: Diagnosis present

## 2024-05-26 DIAGNOSIS — Z90710 Acquired absence of both cervix and uterus: Secondary | ICD-10-CM

## 2024-05-26 DIAGNOSIS — T50915A Adverse effect of multiple unspecified drugs, medicaments and biological substances, initial encounter: Secondary | ICD-10-CM | POA: Diagnosis present

## 2024-05-26 DIAGNOSIS — E039 Hypothyroidism, unspecified: Secondary | ICD-10-CM | POA: Diagnosis present

## 2024-05-26 DIAGNOSIS — Z515 Encounter for palliative care: Secondary | ICD-10-CM

## 2024-05-26 DIAGNOSIS — R296 Repeated falls: Secondary | ICD-10-CM | POA: Diagnosis present

## 2024-05-26 DIAGNOSIS — J189 Pneumonia, unspecified organism: Secondary | ICD-10-CM

## 2024-05-26 DIAGNOSIS — W19XXXA Unspecified fall, initial encounter: Secondary | ICD-10-CM | POA: Diagnosis present

## 2024-05-26 DIAGNOSIS — G9349 Other encephalopathy: Secondary | ICD-10-CM | POA: Diagnosis present

## 2024-05-26 DIAGNOSIS — E119 Type 2 diabetes mellitus without complications: Secondary | ICD-10-CM | POA: Diagnosis present

## 2024-05-26 DIAGNOSIS — K219 Gastro-esophageal reflux disease without esophagitis: Secondary | ICD-10-CM | POA: Diagnosis present

## 2024-05-26 DIAGNOSIS — Z7982 Long term (current) use of aspirin: Secondary | ICD-10-CM

## 2024-05-26 DIAGNOSIS — R29818 Other symptoms and signs involving the nervous system: Secondary | ICD-10-CM | POA: Diagnosis present

## 2024-05-26 DIAGNOSIS — D72829 Elevated white blood cell count, unspecified: Secondary | ICD-10-CM | POA: Diagnosis present

## 2024-05-26 DIAGNOSIS — I6389 Other cerebral infarction: Principal | ICD-10-CM | POA: Diagnosis present

## 2024-05-26 DIAGNOSIS — R509 Fever, unspecified: Secondary | ICD-10-CM | POA: Diagnosis present

## 2024-05-26 DIAGNOSIS — Z79899 Other long term (current) drug therapy: Secondary | ICD-10-CM

## 2024-05-26 DIAGNOSIS — F40240 Claustrophobia: Secondary | ICD-10-CM | POA: Diagnosis present

## 2024-05-26 DIAGNOSIS — Z66 Do not resuscitate: Secondary | ICD-10-CM | POA: Diagnosis present

## 2024-05-26 DIAGNOSIS — C8339 Primary central nervous system lymphoma: Secondary | ICD-10-CM | POA: Diagnosis present

## 2024-05-26 DIAGNOSIS — Z79890 Hormone replacement therapy (postmenopausal): Secondary | ICD-10-CM

## 2024-05-26 DIAGNOSIS — Z8249 Family history of ischemic heart disease and other diseases of the circulatory system: Secondary | ICD-10-CM

## 2024-05-26 DIAGNOSIS — G928 Other toxic encephalopathy: Secondary | ICD-10-CM | POA: Diagnosis present

## 2024-05-26 DIAGNOSIS — G8929 Other chronic pain: Secondary | ICD-10-CM | POA: Diagnosis present

## 2024-05-26 DIAGNOSIS — G9389 Other specified disorders of brain: Secondary | ICD-10-CM

## 2024-05-26 DIAGNOSIS — I10 Essential (primary) hypertension: Secondary | ICD-10-CM | POA: Diagnosis present

## 2024-05-26 DIAGNOSIS — K900 Celiac disease: Secondary | ICD-10-CM | POA: Diagnosis present

## 2024-05-26 DIAGNOSIS — G4700 Insomnia, unspecified: Secondary | ICD-10-CM | POA: Diagnosis present

## 2024-05-26 DIAGNOSIS — R0902 Hypoxemia: Secondary | ICD-10-CM | POA: Diagnosis present

## 2024-05-26 LAB — URINALYSIS, W/ REFLEX TO CULTURE (INFECTION SUSPECTED)
Bilirubin Urine: NEGATIVE
Glucose, UA: NEGATIVE mg/dL
Hgb urine dipstick: NEGATIVE
Ketones, ur: NEGATIVE mg/dL
Nitrite: NEGATIVE
Protein, ur: 30 mg/dL — AB
Specific Gravity, Urine: 1.025 (ref 1.005–1.030)
pH: 5 (ref 5.0–8.0)

## 2024-05-26 LAB — CBC WITH DIFFERENTIAL/PLATELET
Basophils Absolute: 0.1 K/uL (ref 0.0–0.1)
Basophils Relative: 1 %
Eosinophils Absolute: 0.1 K/uL (ref 0.0–0.5)
Eosinophils Relative: 1 %
HCT: 37.8 % (ref 36.0–46.0)
Hemoglobin: 12.8 g/dL (ref 12.0–15.0)
Lymphocytes Relative: 62 %
Lymphs Abs: 8.7 K/uL — ABNORMAL HIGH (ref 0.7–4.0)
MCH: 30.9 pg (ref 26.0–34.0)
MCHC: 33.9 g/dL (ref 30.0–36.0)
MCV: 91.3 fL (ref 80.0–100.0)
Monocytes Absolute: 0.4 K/uL (ref 0.1–1.0)
Monocytes Relative: 3 %
Neutro Abs: 4.7 K/uL (ref 1.7–7.7)
Neutrophils Relative %: 33 %
Platelets: 196 K/uL (ref 150–400)
RBC: 4.14 MIL/uL (ref 3.87–5.11)
RDW: 13.4 % (ref 11.5–15.5)
WBC: 14.1 K/uL — ABNORMAL HIGH (ref 4.0–10.5)
nRBC: 0 % (ref 0.0–0.2)

## 2024-05-26 LAB — I-STAT CHEM 8, ED
BUN: 11 mg/dL (ref 8–23)
Calcium, Ion: 1.17 mmol/L (ref 1.15–1.40)
Chloride: 101 mmol/L (ref 98–111)
Creatinine, Ser: 1.2 mg/dL — ABNORMAL HIGH (ref 0.44–1.00)
Glucose, Bld: 103 mg/dL — ABNORMAL HIGH (ref 70–99)
HCT: 36 % (ref 36.0–46.0)
Hemoglobin: 12.2 g/dL (ref 12.0–15.0)
Potassium: 3.8 mmol/L (ref 3.5–5.1)
Sodium: 141 mmol/L (ref 135–145)
TCO2: 27 mmol/L (ref 22–32)

## 2024-05-26 LAB — COMPREHENSIVE METABOLIC PANEL WITH GFR
ALT: 33 U/L (ref 0–44)
AST: 49 U/L — ABNORMAL HIGH (ref 15–41)
Albumin: 3.6 g/dL (ref 3.5–5.0)
Alkaline Phosphatase: 81 U/L (ref 38–126)
Anion gap: 9 (ref 5–15)
BUN: 11 mg/dL (ref 8–23)
CO2: 29 mmol/L (ref 22–32)
Calcium: 9.5 mg/dL (ref 8.9–10.3)
Chloride: 102 mmol/L (ref 98–111)
Creatinine, Ser: 1.15 mg/dL — ABNORMAL HIGH (ref 0.44–1.00)
GFR, Estimated: 45 mL/min — ABNORMAL LOW
Glucose, Bld: 105 mg/dL — ABNORMAL HIGH (ref 70–99)
Potassium: 3.9 mmol/L (ref 3.5–5.1)
Sodium: 140 mmol/L (ref 135–145)
Total Bilirubin: 0.5 mg/dL (ref 0.0–1.2)
Total Protein: 6.7 g/dL (ref 6.5–8.1)

## 2024-05-26 LAB — I-STAT CG4 LACTIC ACID, ED
Lactic Acid, Venous: 1.4 mmol/L (ref 0.5–1.9)
Lactic Acid, Venous: 1 mmol/L (ref 0.5–1.9)

## 2024-05-26 LAB — I-STAT VENOUS BLOOD GAS, ED
Acid-Base Excess: 4 mmol/L — ABNORMAL HIGH (ref 0.0–2.0)
Bicarbonate: 29.5 mmol/L — ABNORMAL HIGH (ref 20.0–28.0)
Calcium, Ion: 1.16 mmol/L (ref 1.15–1.40)
HCT: 36 % (ref 36.0–46.0)
Hemoglobin: 12.2 g/dL (ref 12.0–15.0)
O2 Saturation: 65 %
Potassium: 3.8 mmol/L (ref 3.5–5.1)
Sodium: 140 mmol/L (ref 135–145)
TCO2: 31 mmol/L (ref 22–32)
pCO2, Ven: 46.1 mmHg (ref 44–60)
pH, Ven: 7.415 (ref 7.25–7.43)
pO2, Ven: 34 mmHg (ref 32–45)

## 2024-05-26 LAB — TSH: TSH: 0.929 u[IU]/mL (ref 0.350–4.500)

## 2024-05-26 LAB — AMMONIA: Ammonia: 41 umol/L — ABNORMAL HIGH (ref 9–35)

## 2024-05-26 MED ORDER — SODIUM CHLORIDE 0.9 % IV SOLN
500.0000 mg | Freq: Once | INTRAVENOUS | Status: AC
  Administered 2024-05-26: 500 mg via INTRAVENOUS
  Filled 2024-05-26: qty 5

## 2024-05-26 MED ORDER — SODIUM CHLORIDE 0.9 % IV SOLN
1.0000 g | Freq: Once | INTRAVENOUS | Status: AC
  Administered 2024-05-26: 1 g via INTRAVENOUS
  Filled 2024-05-26: qty 10

## 2024-05-26 MED ORDER — FENTANYL CITRATE (PF) 50 MCG/ML IJ SOSY
25.0000 ug | PREFILLED_SYRINGE | Freq: Once | INTRAMUSCULAR | Status: AC
  Administered 2024-05-26: 25 ug via INTRAVENOUS
  Filled 2024-05-26: qty 1

## 2024-05-26 MED ORDER — DIAZEPAM 5 MG/ML IJ SOLN
5.0000 mg | Freq: Once | INTRAMUSCULAR | Status: AC
  Administered 2024-05-26: 5 mg via INTRAVENOUS
  Filled 2024-05-26: qty 2

## 2024-05-26 MED ORDER — IOHEXOL 350 MG/ML SOLN
75.0000 mL | Freq: Once | INTRAVENOUS | Status: AC | PRN
  Administered 2024-05-26: 75 mL via INTRAVENOUS

## 2024-05-26 MED ORDER — DEXAMETHASONE SODIUM PHOSPHATE 4 MG/ML IJ SOLN
4.0000 mg | Freq: Once | INTRAMUSCULAR | Status: AC
  Administered 2024-05-26: 4 mg via INTRAVENOUS
  Filled 2024-05-26: qty 1

## 2024-05-26 MED ORDER — LORAZEPAM 2 MG/ML IJ SOLN
1.0000 mg | Freq: Once | INTRAMUSCULAR | Status: AC
  Administered 2024-05-26: 1 mg via INTRAVENOUS
  Filled 2024-05-26: qty 1

## 2024-05-26 NOTE — ED Provider Notes (Signed)
" °  Physical Exam  BP (!) 149/63   Pulse 75   Temp 98.2 F (36.8 C) (Oral)   Resp 17   SpO2 98%   Physical Exam  Procedures  Procedures  ED Course / MDM    Medical Decision Making Care assumed at 3 PM.  Patient is here with left-sided weakness.  Patient was seen several days ago and did not have weakness at that time.  Patient is also hypoxic.  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  CT chest and pelvis unremarkable but chest x-ray showed possible pneumonia.  Patient received IV Rocephin  and azithromycin .  Patient is extremely claustrophobic and I tried Ativan  and Valium  and she is unable to tolerate MRI.  I discussed with Dr. Colon.  He recommend Decadron  4 mg twice daily.  He states that he will see the patient as a consult.  He agreed with medicine service admission.   Problems Addressed: Brain mass: acute illness or injury Fever, unspecified fever cause: acute illness or injury Hypoxia: acute illness or injury Left-sided weakness: acute illness or injury  Amount and/or Complexity of Data Reviewed Labs: ordered. Decision-making details documented in ED Course. Radiology: ordered and independent interpretation performed. Decision-making details documented in ED Course.  Risk Prescription drug management.          Patt Alm Macho, MD 05/26/24 2011  "

## 2024-05-26 NOTE — ED Provider Notes (Signed)
 "  EMERGENCY DEPARTMENT AT [facility] Provider Note   CSN: [id number] Arrival date & time: 05/26/24  1230     Patient presents with: No chief complaint on file.   [name] is a 89 y.o. female.   The history is provided by the patient and medical records. No language interpreter was used.  Weakness Severity:  Severe Onset quality:  Unable to specify Timing:  Unable to specify Progression:  Unable to specify Chronicity:  New Relieved by:  Nothing Worsened by:  Nothing Ineffective treatments:  None tried Associated symptoms: abdominal pain, falls, fever, sensory-motor deficit and shortness of breath   Associated symptoms: no chest pain, no cough, no diarrhea, no dysuria, no numbness in extremities, no headaches, no nausea and no vomiting        Prior to Admission medications  Medication Sig Start Date End Date Taking? Authorizing Provider  ALPRAZolam  (XANAX ) 1 MG tablet Take 1 mg by mouth 3 (three) times daily as needed for anxiety.     [provider]  aspirin EC 81 MG tablet Take 162 mg by mouth daily.    [provider]  estradiol  (ALORA ) 0.05 MG/24HR patch Place 1 patch onto the skin 2 (two) times a week.    [provider]  hydrochlorothiazide (MICROZIDE) 12.5 MG capsule Take 12.5 mg by mouth every morning. 09/24/15   [provider]  HYDROcodone -acetaminophen  (NORCO/VICODIN) 5-325 MG tablet Take 1 tablet by mouth every 6 (six) hours as needed. 05/23/24   Patt Alm Macho, MD  levothyroxine  (SYNTHROID , LEVOTHROID) 75 MCG tablet Take 75 mcg by mouth daily before breakfast.    [provider]  metoprolol  succinate (TOPROL -XL) 50 MG 24 hr tablet Take 50 mg by mouth 2 (two) times daily. Take with or immediately following a meal.    [provider]  Multiple Vitamins-Minerals (MULTIVITAMIN GUMMIES ADULTS) CHEW Chew 2 tablets by mouth daily.    [provider]  potassium chloride  SA  (K-DUR,KLOR-CON ) 20 MEQ tablet One (1) po bid x 2 days, then one (1) po once a day 11/03/15   Steinl, Kevin, MD  traMADol  (ULTRAM ) 50 MG tablet Take by mouth every 6 (six) hours as needed for moderate pain.     [provider]    Allergies: Codeine and Phenergan [promethazine hcl]    Review of Systems  Constitutional:  Positive for chills, fatigue and fever.  HENT:  Negative for congestion.   Respiratory:  Positive for shortness of breath. Negative for cough, chest tightness and wheezing.   Cardiovascular:  Negative for chest pain and palpitations.  Gastrointestinal:  Positive for abdominal pain. Negative for constipation, diarrhea, nausea and vomiting.  Genitourinary:  Negative for dysuria and flank pain.  Musculoskeletal:  Positive for falls. Negative for back pain, neck pain and neck stiffness.  Skin:  Negative for wound.  Neurological:  Positive for weakness. Negative for headaches.  Psychiatric/Behavioral:  Negative for agitation and confusion.   All other systems reviewed and are negative.   Updated Vital Signs Pulse 72   Temp (!) 100.4 F (38 C) (Oral)   Resp 17   SpO2 97%   Physical Exam Vitals and nursing note reviewed.  Constitutional:      General: She is not in acute distress.    Appearance: She is well-developed. She is not ill-appearing, toxic-appearing or diaphoretic.  HENT:     Head: Normocephalic and atraumatic.     Nose: No congestion or rhinorrhea.     Mouth/Throat:  Mouth: Mucous membranes are moist.     Pharynx: No oropharyngeal exudate or posterior oropharyngeal erythema.  Eyes:     Extraocular Movements: Extraocular movements intact.     Conjunctiva/sclera: Conjunctivae normal.     Pupils: Pupils are equal, round, and reactive to light.  Cardiovascular:     Rate and Rhythm: Normal rate and regular rhythm.     Heart sounds: No murmur heard. Pulmonary:     Effort: Pulmonary effort is normal. No respiratory distress.     Breath sounds:  Rhonchi present. No wheezing or rales.  Chest:     Chest wall: No tenderness.  Abdominal:     General: Abdomen is flat.     Palpations: Abdomen is soft.     Tenderness: There is no abdominal tenderness.  Musculoskeletal:        General: No swelling or tenderness.     Cervical back: Neck supple.  Skin:    General: Skin is warm and dry.     Capillary Refill: Capillary refill takes less than 2 seconds.     Findings: No erythema or rash.  Neurological:     Mental Status: She is alert.     Sensory: No sensory deficit.     Motor: Weakness present.  Psychiatric:        Mood and Affect: Mood normal.     (all labs ordered are listed, but only abnormal results are displayed) Labs Reviewed  CBC WITH DIFFERENTIAL/PLATELET - Abnormal; Notable for the following components:      Result Value   WBC 14.1 (*)    Lymphs Abs 8.7 (*)    All other components within normal limits  COMPREHENSIVE METABOLIC PANEL WITH GFR - Abnormal; Notable for the following components:   Glucose, Bld 105 (*)    Creatinine, Ser 1.15 (*)    AST 49 (*)    GFR, Estimated 45 (*)    All other components within normal limits  AMMONIA - Abnormal; Notable for the following components:   Ammonia 41 (*)    All other components within normal limits  I-STAT CHEM 8, ED - Abnormal; Notable for the following components:   Creatinine, Ser 1.20 (*)    Glucose, Bld 103 (*)    All other components within normal limits  I-STAT VENOUS BLOOD GAS, ED - Abnormal; Notable for the following components:   Bicarbonate 29.5 (*)    Acid-Base Excess 4.0 (*)    All other components within normal limits  RESP PANEL BY RT-PCR (RSV, FLU A&B, COVID)  RVPGX2  TSH  URINALYSIS, W/ REFLEX TO CULTURE (INFECTION SUSPECTED)  I-STAT CG4 LACTIC ACID, ED  I-STAT CG4 LACTIC ACID, ED    EKG: EKG Interpretation Date/Time:  Saturday May 26 2024 13:55:17 EST Ventricular Rate:  72 PR Interval:  222 QRS Duration:  82 QT Interval:  371 QTC  Calculation: 406 R Axis:   -25  Text Interpretation: Sinus rhythm Prolonged PR interval Borderline left axis deviation Low voltage, precordial leads Consider anterior infarct when compared top rior, similar appearance No STEMI Confirmed by Ginger Barefoot ([id number]) on 05/26/2024 2:06:38 PM  Radiology: No results found.   Procedures   Medications Ordered in the ED - No data to display                                  Medical Decision Making Amount and/or Complexity of Data Reviewed Labs: ordered. Radiology: ordered.  [name] is a 89 y.o. female with a past medical history significant for GERD, hypertension, migraines, hypothyroidism, previous hysterectomy, previous cholecystectomy, and previous spine surgery who presents for altered mental status.  According to EMS, she was last normal at midnight and was found today more altered and nonresponsive.  She reportedly had a fall several days ago and had reassuring workup but was given several pills of pain medicine.  She reportedly may have taken several overnight and she did have some pinpoint pupils per EMS.  She was given Narcan but oxygen saturations still were in the 80s.  There is no documented history of stroke but EMS said she may have had some left-sided deficits at baseline however she has more profound weakness in her left arm and left leg now.  It is unclear as patient says she has never had a stroke.  Patient did say she has had some cough but denies any chest pain.  She denies fevers or chills but felt warm to the touch.  She denies urinary symptoms or bowel symptoms.  On my exam, patient indeed has weakness in her left arm and left leg compared to the right side.  She had symmetric smile.  She is answering questions appropriately and speech is clear.  EMS that  she normally does not have any difficulty with altered mental status and she is still slightly altered.  She denied any speech difficulty or vision changes and she  thus was not an LVO activation for the unilateral weakness with onset being at midnight.  We will get imaging of her head and neck to look for stroke.  Will order MRI as well.  Will order CT of the chest/abdomen/pelvis given her fever, hypoxia, and she also had some pain and tenderness on her left lower abdomen and pelvis area.  Will make sure she does not have some occult injury from the trauma and make sure she does not have diverticulitis or some other infectious cause of this fever that we found now.  Anticipate reassessment after her CTs, MRI, workup but anticipate admission with the new hypoxia and new neurologic deficits.  3:39 PM Care transferred oncoming team to wait for workup results and ultimately admission.      Final diagnoses:  Left-sided weakness  Hypoxia  Fever, unspecified fever cause      Clinical Impression: 1. Left-sided weakness   2. Hypoxia   3. Fever, unspecified fever cause     Disposition: Admit  This note was prepared with assistance of Dragon voice recognition software. Occasional wrong-word or sound-a-like substitutions may have occurred due to the inherent limitations of voice recognition software.      Malaina Mortellaro, Lonni PARAS, MD 05/26/24 [phone]  "
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
## 2024-05-26 NOTE — ED Notes (Signed)
 MRI aware pt has been medicated for known claustrophobia.

## 2024-05-26 NOTE — ED Notes (Signed)
 Pt returned from MRI, stretcher locked in lowest position, pt in position of comfort with call bell in reach.

## 2024-05-26 NOTE — ED Notes (Signed)
 Patient transported to MRI

## 2024-05-26 NOTE — ED Triage Notes (Signed)
 Pt from home, sister called EMS bc pt was more lethargic than usual. Pt had recent fall Christmas eve and receieved 10 tablets of oxycodone and EMS found that 6 were left in bottle. Pt has a lean to left at baseline.  LSN midnight. Pupils pinpint, O2 82% on room air. Ems GAVE 1MG  OF NARCAN.

## 2024-05-27 ENCOUNTER — Observation Stay (HOSPITAL_COMMUNITY)

## 2024-05-27 DIAGNOSIS — R296 Repeated falls: Secondary | ICD-10-CM | POA: Diagnosis present

## 2024-05-27 DIAGNOSIS — Z66 Do not resuscitate: Secondary | ICD-10-CM | POA: Diagnosis present

## 2024-05-27 DIAGNOSIS — E039 Hypothyroidism, unspecified: Secondary | ICD-10-CM | POA: Diagnosis present

## 2024-05-27 DIAGNOSIS — Z8249 Family history of ischemic heart disease and other diseases of the circulatory system: Secondary | ICD-10-CM | POA: Diagnosis not present

## 2024-05-27 DIAGNOSIS — G9389 Other specified disorders of brain: Secondary | ICD-10-CM | POA: Diagnosis not present

## 2024-05-27 DIAGNOSIS — Z7982 Long term (current) use of aspirin: Secondary | ICD-10-CM | POA: Diagnosis not present

## 2024-05-27 DIAGNOSIS — C8339 Primary central nervous system lymphoma: Secondary | ICD-10-CM | POA: Diagnosis present

## 2024-05-27 DIAGNOSIS — G8929 Other chronic pain: Secondary | ICD-10-CM | POA: Diagnosis present

## 2024-05-27 DIAGNOSIS — D72829 Elevated white blood cell count, unspecified: Secondary | ICD-10-CM | POA: Diagnosis present

## 2024-05-27 DIAGNOSIS — E119 Type 2 diabetes mellitus without complications: Secondary | ICD-10-CM | POA: Diagnosis present

## 2024-05-27 DIAGNOSIS — C7931 Secondary malignant neoplasm of brain: Secondary | ICD-10-CM | POA: Diagnosis present

## 2024-05-27 DIAGNOSIS — F411 Generalized anxiety disorder: Secondary | ICD-10-CM | POA: Diagnosis present

## 2024-05-27 DIAGNOSIS — G8194 Hemiplegia, unspecified affecting left nondominant side: Secondary | ICD-10-CM | POA: Diagnosis present

## 2024-05-27 DIAGNOSIS — E663 Overweight: Secondary | ICD-10-CM | POA: Diagnosis present

## 2024-05-27 DIAGNOSIS — Z515 Encounter for palliative care: Secondary | ICD-10-CM | POA: Diagnosis not present

## 2024-05-27 DIAGNOSIS — Z7189 Other specified counseling: Secondary | ICD-10-CM | POA: Diagnosis not present

## 2024-05-27 DIAGNOSIS — J189 Pneumonia, unspecified organism: Secondary | ICD-10-CM | POA: Diagnosis not present

## 2024-05-27 DIAGNOSIS — I6389 Other cerebral infarction: Secondary | ICD-10-CM | POA: Diagnosis present

## 2024-05-27 DIAGNOSIS — G9349 Other encephalopathy: Secondary | ICD-10-CM | POA: Diagnosis present

## 2024-05-27 DIAGNOSIS — I10 Essential (primary) hypertension: Secondary | ICD-10-CM | POA: Diagnosis present

## 2024-05-27 DIAGNOSIS — G4700 Insomnia, unspecified: Secondary | ICD-10-CM | POA: Diagnosis present

## 2024-05-27 DIAGNOSIS — R638 Other symptoms and signs concerning food and fluid intake: Secondary | ICD-10-CM | POA: Diagnosis not present

## 2024-05-27 DIAGNOSIS — G936 Cerebral edema: Secondary | ICD-10-CM | POA: Diagnosis present

## 2024-05-27 DIAGNOSIS — K219 Gastro-esophageal reflux disease without esophagitis: Secondary | ICD-10-CM | POA: Diagnosis present

## 2024-05-27 DIAGNOSIS — W19XXXA Unspecified fall, initial encounter: Secondary | ICD-10-CM | POA: Diagnosis present

## 2024-05-27 DIAGNOSIS — G928 Other toxic encephalopathy: Secondary | ICD-10-CM | POA: Diagnosis present

## 2024-05-27 DIAGNOSIS — R531 Weakness: Secondary | ICD-10-CM | POA: Diagnosis present

## 2024-05-27 DIAGNOSIS — Z79890 Hormone replacement therapy (postmenopausal): Secondary | ICD-10-CM | POA: Diagnosis not present

## 2024-05-27 DIAGNOSIS — R54 Age-related physical debility: Secondary | ICD-10-CM | POA: Diagnosis present

## 2024-05-27 DIAGNOSIS — Z1152 Encounter for screening for COVID-19: Secondary | ICD-10-CM | POA: Diagnosis not present

## 2024-05-27 DIAGNOSIS — R509 Fever, unspecified: Secondary | ICD-10-CM | POA: Diagnosis not present

## 2024-05-27 LAB — BASIC METABOLIC PANEL WITH GFR
Anion gap: 10 (ref 5–15)
BUN: 14 mg/dL (ref 8–23)
CO2: 26 mmol/L (ref 22–32)
Calcium: 9.2 mg/dL (ref 8.9–10.3)
Chloride: 102 mmol/L (ref 98–111)
Creatinine, Ser: 0.92 mg/dL (ref 0.44–1.00)
GFR, Estimated: 59 mL/min — ABNORMAL LOW
Glucose, Bld: 163 mg/dL — ABNORMAL HIGH (ref 70–99)
Potassium: 3.9 mmol/L (ref 3.5–5.1)
Sodium: 138 mmol/L (ref 135–145)

## 2024-05-27 LAB — CBG MONITORING, ED: Glucose-Capillary: 153 mg/dL — ABNORMAL HIGH (ref 70–99)

## 2024-05-27 LAB — CBC WITH DIFFERENTIAL/PLATELET
Abs Immature Granulocytes: 0.04 K/uL (ref 0.00–0.07)
Basophils Absolute: 0 K/uL (ref 0.0–0.1)
Basophils Relative: 0 %
Eosinophils Absolute: 0 K/uL (ref 0.0–0.5)
Eosinophils Relative: 0 %
HCT: 35.9 % — ABNORMAL LOW (ref 36.0–46.0)
Hemoglobin: 12.1 g/dL (ref 12.0–15.0)
Immature Granulocytes: 1 %
Lymphocytes Relative: 52 %
Lymphs Abs: 4.2 K/uL — ABNORMAL HIGH (ref 0.7–4.0)
MCH: 30.5 pg (ref 26.0–34.0)
MCHC: 33.7 g/dL (ref 30.0–36.0)
MCV: 90.4 fL (ref 80.0–100.0)
Monocytes Absolute: 0.2 K/uL (ref 0.1–1.0)
Monocytes Relative: 2 %
Neutro Abs: 3.6 K/uL (ref 1.7–7.7)
Neutrophils Relative %: 45 %
Platelets: 158 K/uL (ref 150–400)
RBC: 3.97 MIL/uL (ref 3.87–5.11)
RDW: 13.2 % (ref 11.5–15.5)
WBC: 8 K/uL (ref 4.0–10.5)
nRBC: 0 % (ref 0.0–0.2)

## 2024-05-27 LAB — RESP PANEL BY RT-PCR (RSV, FLU A&B, COVID)  RVPGX2
Influenza A by PCR: NEGATIVE
Influenza B by PCR: NEGATIVE
Resp Syncytial Virus by PCR: NEGATIVE
SARS Coronavirus 2 by RT PCR: NEGATIVE

## 2024-05-27 LAB — ACETAMINOPHEN LEVEL: Acetaminophen (Tylenol), Serum: <10 ug/mL — ABNORMAL LOW (ref 10–30)

## 2024-05-27 MED ORDER — METOPROLOL SUCCINATE ER 25 MG PO TB24
12.5000 mg | ORAL_TABLET | Freq: Two times a day (BID) | ORAL | Status: AC
  Administered 2024-05-27 – 2024-06-03 (×10): 12.5 mg via ORAL
  Filled 2024-05-27 (×2): qty 1

## 2024-05-27 MED ORDER — OXYBUTYNIN CHLORIDE ER 5 MG PO TB24
5.0000 mg | ORAL_TABLET | Freq: Every day | ORAL | Status: DC
  Administered 2024-05-27 – 2024-06-11 (×13): 5 mg via ORAL
  Filled 2024-05-27 (×9): qty 1

## 2024-05-27 MED ORDER — DEXAMETHASONE SODIUM PHOSPHATE 4 MG/ML IJ SOLN
4.0000 mg | Freq: Two times a day (BID) | INTRAMUSCULAR | Status: DC
  Administered 2024-05-27 – 2024-05-31 (×10): 4 mg via INTRAVENOUS
  Filled 2024-05-27 (×3): qty 1

## 2024-05-27 MED ORDER — LEVOTHYROXINE SODIUM 75 MCG PO TABS
75.0000 ug | ORAL_TABLET | Freq: Every day | ORAL | Status: DC
  Administered 2024-05-28 – 2024-05-30 (×2): 75 ug via ORAL

## 2024-05-27 MED ORDER — SODIUM CHLORIDE 0.9% FLUSH
3.0000 mL | Freq: Two times a day (BID) | INTRAVENOUS | Status: DC
  Administered 2024-05-27 – 2024-06-11 (×22): 3 mL via INTRAVENOUS

## 2024-05-27 MED ORDER — PANTOPRAZOLE SODIUM 40 MG PO TBEC
40.0000 mg | DELAYED_RELEASE_TABLET | Freq: Every day | ORAL | Status: DC
  Administered 2024-05-27 – 2024-05-30 (×3): 40 mg via ORAL
  Filled 2024-05-27: qty 1

## 2024-05-27 MED ORDER — HYDROCODONE-ACETAMINOPHEN 5-325 MG PO TABS
1.0000 | ORAL_TABLET | Freq: Four times a day (QID) | ORAL | Status: DC | PRN
  Administered 2024-05-27 – 2024-06-11 (×17): 1 via ORAL
  Filled 2024-05-27 (×11): qty 1

## 2024-05-27 MED ORDER — HEPARIN SODIUM (PORCINE) 5000 UNIT/ML IJ SOLN
5000.0000 [IU] | Freq: Three times a day (TID) | INTRAMUSCULAR | Status: DC
  Administered 2024-05-27 – 2024-05-29 (×7): 5000 [IU] via SUBCUTANEOUS
  Filled 2024-05-27 (×3): qty 1

## 2024-05-27 MED ORDER — ALPRAZOLAM 0.5 MG PO TABS
0.5000 mg | ORAL_TABLET | Freq: Two times a day (BID) | ORAL | Status: DC | PRN
  Administered 2024-05-27 (×2): 0.5 mg via ORAL
  Filled 2024-05-27: qty 1
  Filled 2024-05-27: qty 2

## 2024-05-27 MED ORDER — ONDANSETRON HCL 4 MG/2ML IJ SOLN
4.0000 mg | Freq: Four times a day (QID) | INTRAMUSCULAR | Status: DC | PRN
  Administered 2024-05-27 – 2024-06-07 (×14): 4 mg via INTRAVENOUS
  Filled 2024-05-27 (×7): qty 2

## 2024-05-27 MED ORDER — GADOBUTROL 1 MMOL/ML IV SOLN
8.0000 mL | Freq: Once | INTRAVENOUS | Status: AC | PRN
  Administered 2024-05-27: 8 mL via INTRAVENOUS

## 2024-05-27 MED ORDER — LORAZEPAM 2 MG/ML IJ SOLN
1.0000 mg | Freq: Once | INTRAMUSCULAR | Status: AC
  Administered 2024-05-27: 1 mg via INTRAVENOUS
  Filled 2024-05-27: qty 1

## 2024-05-27 MED ORDER — SODIUM CHLORIDE 0.9 % IV SOLN: INTRAVENOUS | Status: AC
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
## 2024-05-27 NOTE — Progress Notes (Signed)
 " PROGRESS NOTE    [name]  FMW:[id number] DOB: 10-08-1930 DOA: 05/26/2024 PCP: Pcp, No    Brief Narrative:  [name] is a 89 year old female with hypertension, hypothyroidism, GERD, chronic pain, and recurrent falls who presents with acute altered mental status and new left-sided weakness.  She was last known normal around midnight. Her sister found her this morning confused with new left arm and leg weakness, prompting EMS transport. At baseline, she is cognitively intact and ambulatory with a walker, without known focal neurologic deficits.    Assessment and Plan: Acute encephalopathy, left sided weakness: No severe metabolic derangements, could be related to temporal mass with midline shift also explaining neurological deficit, also possibly post-concussive, and also possibly from polypharmacy (opioids, pinpoint pupils, and later benzo x2 for MRI). CK wnl.  - Neuro checks q4h - hold sedating meds   Right temporal mass:  - Given decadron  given her mental status changes/neurological deficits, will continue (though this would reduce biopsy yield) for now. Decadron  4mg  BID.  - willing to re-try MRI with ativan - tech said 10-15 min scan   Lymphocytic leukocytosis: Not typical of bacterial infection, unclear acuity (previous labs from 2017 were normal).  -WBC normal on 12/28   Hypoxia: Given history, suspect hypoventilation primarily due to AMS. No infiltrate or other causative etiology on CT chest.  - Supplement O2 as needed to maintain normal WOB and adequate saturations. Encourage IS.  - No normal PMNs, afebrile, no infiltrate, will not plan to continue abx at this time.    Hypothyroidism: -reorder synthroid  (listed as 75mcg). TSH was wnl.    Anxiety:  - PDMP notes regular prescriptions from [facility] (in [place], [place]) of xanax  0.5mg  #60/30-days. Given risk of seizure with withdrawal, will reorder this prn and of course would hold for sedation.      DVT  prophylaxis: heparin  injection 5,000 Units Start: 05/27/24 0600    Code Status: Full Code Family Communication: at bedside  Disposition Plan:  Level of care: Med-Surg Status is: Observation     Consultants:  NS   Subjective: Agreeable to try MRI again  Objective: Vitals:   05/27/24 0430 05/27/24 0445 05/27/24 0500 05/27/24 0805  BP:    (!) 132/52  Pulse: 73 75 75 83  Resp: (!) 21 20 13 13   Temp:    98.8 F (37.1 C)  TempSrc:    Axillary  SpO2: 95% 95% 96% 97%    Intake/Output Summary (Last 24 hours) at 05/27/2024 1008 Last data filed at 05/26/2024 2238 Gross per 24 hour  Intake 583.83 ml  Output --  Net 583.83 ml   There were no vitals filed for this visit.  Examination:   General: Appearance:    Overweight female in no acute distress     Lungs:      respirations unlabored  Heart:    Normal heart rate.    MS:   All extremities are intact.    Neurologic:   Awake, alert       Data Reviewed: I have personally reviewed following labs and imaging studies  CBC: Recent Labs  Lab 05/23/24 1311 05/26/24 1254 05/26/24 1302 05/27/24 0314  WBC 12.4* 14.1*  --  8.0  NEUTROABS 2.6 4.7  --  3.6  HGB 14.3 12.8 12.2  12.2 12.1  HCT 42.6 37.8 36.0  36.0 35.9*  MCV 92.2 91.3  --  90.4  PLT 190 196  --  158   Basic Metabolic Panel: Recent Labs  Lab  05/23/24 1311 05/26/24 1254 05/26/24 1302 05/27/24 0314  NA 138 140 140  141 138  K 3.9 3.9 3.8  3.8 3.9  CL 101 102 101 102  CO2 27 29  --  26  GLUCOSE 92 105* 103* 163*  BUN 14 11 11 14   CREATININE 1.10* 1.15* 1.20* 0.92  CALCIUM  10.0 9.5  --  9.2   GFR: CrCl cannot be calculated (Unknown ideal weight.). Liver Function Tests: Recent Labs  Lab 05/23/24 1311 05/26/24 1254  AST 22 49*  ALT 16 33  ALKPHOS 85 81  BILITOT 0.5 0.5  PROT 7.4 6.7  ALBUMIN  4.1 3.6   No results for input(s): LIPASE, AMYLASE in the last 168 hours. Recent Labs  Lab 05/26/24 1242  AMMONIA 41*   Coagulation  Profile: No results for input(s): INR, PROTIME in the last 168 hours. Cardiac Enzymes: Recent Labs  Lab 05/23/24 1311  CKTOTAL 48   BNP (last 3 results) No results for input(s): PROBNP in the last 8760 hours. HbA1C: No results for input(s): HGBA1C in the last 72 hours. CBG: Recent Labs  Lab 05/27/24 0458  GLUCAP 153*   Lipid Profile: No results for input(s): CHOL, HDL, LDLCALC, TRIG, CHOLHDL, LDLDIRECT in the last 72 hours. Thyroid Function Tests: Recent Labs    05/26/24 1254  TSH 0.929   Anemia Panel: No results for input(s): VITAMINB12, FOLATE, FERRITIN, TIBC, IRON, RETICCTPCT in the last 72 hours. Sepsis Labs: Recent Labs  Lab 05/26/24 1307 05/26/24 1649  LATICACIDVEN 1.4 1.0    Recent Results (from the past 240 hours)  Resp panel by RT-PCR (RSV, Flu A&B, Covid) Anterior Nasal Swab     Status: None   Collection Time: 05/23/24  1:23 PM   Specimen: Anterior Nasal Swab  Result Value Ref Range Status   SARS Coronavirus 2 by RT PCR NEGATIVE NEGATIVE Final   Influenza A by PCR NEGATIVE NEGATIVE Final   Influenza B by PCR NEGATIVE NEGATIVE Final    Comment: (NOTE) The Xpert Xpress SARS-CoV-2/FLU/RSV plus assay is intended as an aid in the diagnosis of influenza from Nasopharyngeal swab specimens and should not be used as a sole basis for treatment. Nasal washings and aspirates are unacceptable for Xpert Xpress SARS-CoV-2/FLU/RSV testing.  Fact Sheet for Patients: bloggercourse.com  Fact Sheet for Healthcare Providers: seriousbroker.it  This test is not yet approved or cleared by the [place]  FDA and has been authorized for detection and/or diagnosis of SARS-CoV-2 by FDA under an Emergency Use Authorization (EUA). This EUA will remain in effect (meaning this test can be used) for the duration of the COVID-19 declaration under Section 564(b)(1) of the Act, 21 U.S.C. section  360bbb-3(b)(1), unless the authorization is terminated or revoked.     Resp Syncytial Virus by PCR NEGATIVE NEGATIVE Final    Comment: (NOTE) Fact Sheet for Patients: bloggercourse.com  Fact Sheet for Healthcare Providers: seriousbroker.it  This test is not yet approved or cleared by the [place]  FDA and has been authorized for detection and/or diagnosis of SARS-CoV-2 by FDA under an Emergency Use Authorization (EUA). This EUA will remain in effect (meaning this test can be used) for the duration of the COVID-19 declaration under Section 564(b)(1) of the Act, 21 U.S.C. section 360bbb-3(b)(1), unless the authorization is terminated or revoked.  Performed at [facility], [address] [place], [place] [address]   Urine Culture     Status: None   Collection Time: 05/23/24  8:02 PM   Specimen: Urine, Catheterized  Result Value Ref Range Status   Specimen Description URINE, CATHETERIZED  Final   Special Requests NONE  Final   Culture   Final    NO GROWTH Performed at [facility], [address], [place], [place] [address]    Report Status 05/25/2024 FINAL  Final  Resp panel by RT-PCR (RSV, Flu A&B, Covid) Anterior Nasal Swab     Status: None   Collection Time: 05/27/24  3:14 AM   Specimen: Anterior Nasal Swab  Result Value Ref Range Status   SARS Coronavirus 2 by RT PCR NEGATIVE NEGATIVE Final   Influenza A by PCR NEGATIVE NEGATIVE Final   Influenza B by PCR NEGATIVE NEGATIVE Final    Comment: (NOTE) The Xpert Xpress SARS-CoV-2/FLU/RSV plus assay is intended as an aid in the diagnosis of influenza from Nasopharyngeal swab specimens and should not be used as a sole basis for treatment. Nasal washings and aspirates are unacceptable for Xpert Xpress SARS-CoV-2/FLU/RSV testing.  Fact Sheet for Patients: bloggercourse.com  Fact Sheet for Healthcare  Providers: seriousbroker.it  This test is not yet approved or cleared by the [place]  FDA and has been authorized for detection and/or diagnosis of SARS-CoV-2 by FDA under an Emergency Use Authorization (EUA). This EUA will remain in effect (meaning this test can be used) for the duration of the COVID-19 declaration under Section 564(b)(1) of the Act, 21 U.S.C. section 360bbb-3(b)(1), unless the authorization is terminated or revoked.     Resp Syncytial Virus by PCR NEGATIVE NEGATIVE Final    Comment: (NOTE) Fact Sheet for Patients: bloggercourse.com  Fact Sheet for Healthcare Providers: seriousbroker.it  This test is not yet approved or cleared by the [place]  FDA and has been authorized for detection and/or diagnosis of SARS-CoV-2 by FDA under an Emergency Use Authorization (EUA). This EUA will remain in effect (meaning this test can be used) for the duration of the COVID-19 declaration under Section 564(b)(1) of the Act, 21 U.S.C. section 360bbb-3(b)(1), unless the authorization is terminated or revoked.  Performed at [facility], [address], [place], [place] [address]          Radiology Studies: DG Shoulder Left Port Result Date: 05/27/2024 EXAM: 1 VIEW(S) XRAY OF THE LEFT SHOULDER 05/27/2024 12:37:00 AM COMPARISON: None available. CLINICAL HISTORY: Left shoulder pain. FINDINGS: BONES AND JOINTS: Moderate glenohumeral joint space narrowing and osteophyte formation. Mild hypertrophic changes of the acromioclavicular joint. No acute fracture. SOFT TISSUES: No abnormal calcifications. Visualized lung is unremarkable. IMPRESSION: 1. No acute findings. 2. Moderate glenohumeral osteoarthritis. 3. Mild acromioclavicular joint osteoarthritis. Electronically signed by: Greig Pique MD 05/27/2024 01:16 AM EST RP Workstation: [id number]   CT ANGIO HEAD NECK W WO CM Result Date:  05/26/2024 EXAM: CTA HEAD AND NECK WITH AND WITHOUT CT HEAD WITHOUT CONTRAST 05/26/2024 06:03:00 PM TECHNIQUE: CT of the head was performed without the administration of intravenous contrast. CTA of the head and neck was performed with and without the administration of intravenous contrast. Multiplanar 2D and/or 3D reformatted images are provided for review. Automated exposure control, iterative reconstruction, and/or weight based adjustment of the mA/kV was utilized to reduce the radiation dose to as low as reasonably achievable. Stenosis of the internal carotid arteries measured using NASCET criteria. COMPARISON: None available CLINICAL HISTORY: Neuro deficit, acute, stroke suspected; Left arm and left leg weakness. Confusion. Recent fall. FINDINGS: Brain: Approximately 1.4 cm mass in the right temporal lobe with probable smaller adjacent mass, which is not well evaluated on this motion  limited CT head. No obvious acute hemorrhage, large vascular territory infarct or hydrocephalus. Sinuses/mastoid: Clear. Calvarium: No acute fracture. AORTIC ARCH AND ARCH VESSELS: No dissection or arterial injury. No significant stenosis of the brachiocephalic or subclavian arteries. CERVICAL CAROTID ARTERIES: Approximately 70% stenosis of the right ICA origin due to atherosclerosis. Left ICA is patent without significant stenosis. CERVICAL VERTEBRAL ARTERIES: No dissection, arterial injury, or significant stenosis. LUNGS AND MEDIASTINUM: Unremarkable. SOFT TISSUES: No acute abnormality. BONES: No acute abnormality. ANTERIOR CIRCULATION: No significant stenosis of the internal carotid arteries. No significant stenosis of the anterior cerebral arteries. No significant stenosis of the middle cerebral arteries. No aneurysm. POSTERIOR CIRCULATION: No significant stenosis of the posterior cerebral arteries. No significant stenosis of the basilar artery. No significant stenosis of the vertebral arteries. No aneurysm. OTHER: No dural  venous sinus thrombosis on this non-dedicated study. IMPRESSION: 1. Approximately 1.4 cm mass in the right temporal lobe with probable smaller adjacent mass, which are not well evaluated on this motion limited CT head. Surrounding edema and 4 mm leftward midline shift. Recommend MRI Head with contrast. 2. No large vessel occlusion. 3. Approximately 70% stenosis of the right ICA origin. Electronically signed by: Gilmore Molt 05/26/2024 07:26 PM EST RP Workstation: [id number]   CT CHEST ABDOMEN PELVIS W CONTRAST Result Date: 05/26/2024 EXAM: CT CHEST, ABDOMEN AND PELVIS WITH CONTRAST 05/26/2024 06:03:00 PM TECHNIQUE: CT of the chest, abdomen and pelvis was performed with the administration of 75 mL of iohexol  (OMNIPAQUE ) 350 MG/ML intravenous contrast. Multiplanar reformatted images are provided for review. Automated exposure control, iterative reconstruction, and/or weight based adjustment of the mA/kV was utilized to reduce the radiation dose to as low as reasonably achievable. COMPARISON: None available. CLINICAL HISTORY: Polytrauma, blunt; Hypoxia with recent fall, left hip pain. Traumatic injury or infection. FINDINGS: CHEST: MEDIASTINUM AND LYMPH NODES: Extensive multivessel coronary artery calcifications. Global cardiac size within normal limits. No pericardial effusion. The central airways are clear. No mediastinal, hilar or axillary lymphadenopathy. Central pulmonary arteries are of normal caliber. Mild atherosclerotic calcification within the thoracic aorta. No aortic aneurysm. LUNGS AND PLEURA: No focal consolidation or pulmonary edema. No pleural effusion or pneumothorax. ABDOMEN AND PELVIS: LIVER: Status post cholecystectomy. Nondependent pneumobilia suggests prior sphincterotomy. Liver otherwise unremarkable. GALLBLADDER AND BILE DUCTS: Status post cholecystectomy. Nondependent pneumobilia suggests prior sphincterotomy. No biliary ductal dilatation. SPLEEN: No acute abnormality. PANCREAS: No acute  abnormality. ADRENAL GLANDS: No acute abnormality. KIDNEYS, URETERS AND BLADDER: 4 mm nonobstructing calculus within the interpolar region of the right kidney. Simple exophytic cortical cyst arises from the upper pole of the right kidney for which a follow up imaging is recommended. Per consensus, no follow-up is needed for simple Bosniak type 1 and 2 renal cysts, unless the patient has a malignancy history or risk factors. No ureteral calculi. No hydronephrosis. No perinephric inflammatory stranding or fluid collections were identified. The bladder is unremarkable. GI AND BOWEL: Appendix normal. The stomach, small bowel, and large bowel are otherwise unremarkable. There is no bowel obstruction. REPRODUCTIVE ORGANS: Uterus absent. No adnexal masses. PERITONEUM AND RETROPERITONEUM: No ascites. No free air. VASCULATURE: Aorta is normal in caliber. Extensive aortoiliac atherosclerotic calcification. ABDOMINAL AND PELVIS LYMPH NODES: No lymphadenopathy. BONES AND SOFT TISSUES: Osseous structures are age appropriate. No acute bone abnormality. No lytic or blastic bone lesion. No focal soft tissue abnormality. IMPRESSION: 1. No acute findings in the chest, abdomen, or pelvis. 2. Extensive multivessel coronary artery calcifications. 3. Status post cholecystectomy with pneumobilia, likely related to prior sphincterotomy. 4.  Prior hysterectomy. 5. 4 mm nonobstructing right renal calculus, without urolithiasis or hydronephrosis. 6. raf score includes Aortic atherosclerosis (icd10-i70.0). Electronically signed by: Dorethia Molt MD 05/26/2024 07:20 PM EST RP Workstation: [id number]   DG Chest Port 1 View Result Date: 05/26/2024 CLINICAL DATA:  Hypoxia. EXAM: PORTABLE CHEST 1 VIEW COMPARISON:  May 23, 2024 FINDINGS: The cardiac silhouette is mildly enlarged and unchanged in size. Low lung volumes are noted. Mild, stable diffusely increased interstitial lung markings are noted. Mild atelectasis and/or infiltrate is seen  within the mid left lung. No pleural effusion or pneumothorax is identified. The visualized skeletal structures are unremarkable. IMPRESSION: 1. Low lung volumes with mild mid left lung atelectasis and/or infiltrate. A mild superimposed component of interstitial edema cannot be excluded. Electronically Signed   By: Suzen Dials M.D.   On: 05/26/2024 17:22        Scheduled Meds:  dexamethasone  (DECADRON ) injection  4 mg Intravenous Q12H   heparin   5,000 Units Subcutaneous Q8H   LORazepam   1 mg Intravenous Once   sodium chloride  flush  3 mL Intravenous Q12H   Continuous Infusions:  sodium chloride  75 mL/hr at 05/27/24 0349     LOS: 0 days    Time spent: 45 minutes spent on chart review, discussion with nursing staff, consultants, updating family and interview/physical exam; more than 50% of that time was spent in counseling and/or coordination of care.    Harlene RAYMOND Bowl, DO [facility] Available via Epic secure chat 7am-7pm After these hours, please refer to coverage provider listed on [email or web address] 05/27/2024, 10:08 AM   "

## 2024-05-28 DIAGNOSIS — R531 Weakness: Secondary | ICD-10-CM | POA: Diagnosis not present

## 2024-05-28 LAB — PATHOLOGIST SMEAR REVIEW

## 2024-05-28 MED ORDER — POLYETHYLENE GLYCOL 3350 17 G PO PACK
17.0000 g | PACK | Freq: Every day | ORAL | Status: DC
  Administered 2024-05-28 – 2024-06-08 (×7): 17 g via ORAL
  Filled 2024-05-28 (×13): qty 1

## 2024-05-28 MED ORDER — ACETAMINOPHEN 325 MG PO TABS
650.0000 mg | ORAL_TABLET | Freq: Four times a day (QID) | ORAL | Status: DC | PRN
  Administered 2024-06-09 – 2024-06-11 (×2): 650 mg via ORAL
  Filled 2024-05-28 (×2): qty 2

## 2024-05-28 MED ORDER — STROKE: EARLY STAGES OF RECOVERY BOOK
Freq: Once | Status: DC
  Filled 2024-05-28: qty 1

## 2024-05-28 NOTE — Progress Notes (Signed)
 Speech Language Pathology  Patient Details Name: [name] MRN: [id number] DOB: 11/10/1930 Today's Date: 05/28/2024 Time: 8654-8641 SLP Time Calculation (min) (ACUTE ONLY): 13 min    Inadvertently performed swallow eval on pt. Assessment note and charge deleted. Speech-language-cognitive assessment will be performed as soon as able.               [name]  05/28/2024, 2:38 PM

## 2024-05-28 NOTE — Progress Notes (Addendum)
 " PROGRESS NOTE    [name]  FMW:[id number] DOB: Oct 27, 1930 DOA: 05/26/2024 PCP: Pcp, No    Brief Narrative:  [name] is a 89 year old female with hypertension, hypothyroidism, GERD, chronic pain, and recurrent falls who presents with acute altered mental status and new left-sided weakness.  She was last known normal around midnight. Her sister found her this morning confused with new left arm and leg weakness, prompting EMS transport. At baseline, she is cognitively intact and ambulatory with a walker, without known focal neurologic deficits.    Assessment and Plan: Acute encephalopathy, left sided weakness: No severe metabolic derangements, could be related to temporal mass with midline shift also explaining neurological deficit, also possibly post-concussive, and also possibly from polypharmacy (opioids, pinpoint pupils, and later benzo x2 for MRI). CK wnl.  - Neuro checks q4h - Limit sedating meds -Ammonia slightly elevated so we will make sure patient has a bowel regimen   Right temporal mass:  - Given decadron  given her mental status changes/neurological deficits, will continue (though this would reduce biopsy yield) for now. Decadron  4mg  BID.  -MRI:  Enhancing mass in the lateral right temporal lobe measuring 1.5 x 1.2 x 1.3 cm, with additional smaller enhancing nodules suggestive of satellite lesions.  Surrounding vasogenic edema and approximately 6 mm leftward midline shift. Findings suspicious for metastatic disease.  - Await recommendations from neurosurgery  Acute CVA -seen on MRI -added stroke orderset -will discuss with neurology-- likely cloc--- (cytotoxic lesion)--- can add asa 81mg   Lymphocytic leukocytosis: Not typical of bacterial infection, unclear acuity (previous labs from 2017 were normal).  -WBC normal on 12/28   Hypoxia: Given history, suspect hypoventilation primarily due to AMS. No infiltrate or other causative etiology on CT chest.  -  Supplement O2 as needed to maintain normal WOB and adequate saturations. Encourage IS.  - No normal PMNs, afebrile, no infiltrate, will not plan to continue abx at this time.    Hypothyroidism: -reorder synthroid  (listed as 75mcg). TSH was wnl.    Anxiety:  - PDMP notes regular prescriptions from [facility] (in [place], [place]) of xanax  0.5mg  #60/30-days. Given risk of seizure with withdrawal, will reorder this prn and of course would hold for sedation.    Delirium precautions  Patient is confused, so have updated sister.  Patient is from [place]  and lives alone-has a nurse that comes every 2 weeks and a housekeeper that comes occasionally.  Patient drove up from [place]  to [facility] to spend Christmas with her sister (her only living relative).  Discussed palliative care consultation while awaiting recommendations from neurosurgery with sister who is in agreement.   DVT prophylaxis: heparin  injection 5,000 Units Start: 05/27/24 0600    Code Status: Full Code Family Communication: Called sister  Disposition Plan:  Level of care: Med-Surg Status is: Observation     Consultants:  NS   Subjective: Confused this a.m. but no complaints of pain  Objective: Vitals:   05/27/24 1803 05/27/24 2023 05/28/24 0030 05/28/24 0749  BP:  (!) 115/47 128/62 (!) 110/55  Pulse:  78 72 70  Resp:  20 18 18   Temp:  (!) 97.4 F (36.3 C) 97.8 F (36.6 C) 98.1 F (36.7 C)  TempSrc:    Oral  SpO2: 100% 95% 96% 95%    Intake/Output Summary (Last 24 hours) at 05/28/2024 0817 Last data filed at 05/27/2024 1555 Gross per 24 hour  Intake 662.66 ml  Output --  Net 662.66 ml   There were no  vitals filed for this visit.  Examination:   General: Appearance:    Overweight female in no acute distress     Lungs:      respirations unlabored  Heart:    Normal heart rate.    MS:   All extremities are intact.    Neurologic: Will awaken but does not answer questions appropriately        Data Reviewed: I have personally reviewed following labs and imaging studies  CBC: Recent Labs  Lab 05/23/24 1311 05/26/24 1254 05/26/24 1302 05/27/24 0314  WBC 12.4* 14.1*  --  8.0  NEUTROABS 2.6 4.7  --  3.6  HGB 14.3 12.8 12.2  12.2 12.1  HCT 42.6 37.8 36.0  36.0 35.9*  MCV 92.2 91.3  --  90.4  PLT 190 196  --  158   Basic Metabolic Panel: Recent Labs  Lab 05/23/24 1311 05/26/24 1254 05/26/24 1302 05/27/24 0314  NA 138 140 140  141 138  K 3.9 3.9 3.8  3.8 3.9  CL 101 102 101 102  CO2 27 29  --  26  GLUCOSE 92 105* 103* 163*  BUN 14 11 11 14   CREATININE 1.10* 1.15* 1.20* 0.92  CALCIUM  10.0 9.5  --  9.2   GFR: CrCl cannot be calculated (Unknown ideal weight.). Liver Function Tests: Recent Labs  Lab 05/23/24 1311 05/26/24 1254  AST 22 49*  ALT 16 33  ALKPHOS 85 81  BILITOT 0.5 0.5  PROT 7.4 6.7  ALBUMIN  4.1 3.6   No results for input(s): LIPASE, AMYLASE in the last 168 hours. Recent Labs  Lab 05/26/24 1242  AMMONIA 41*   Coagulation Profile: No results for input(s): INR, PROTIME in the last 168 hours. Cardiac Enzymes: Recent Labs  Lab 05/23/24 1311  CKTOTAL 48   BNP (last 3 results) No results for input(s): PROBNP in the last 8760 hours. HbA1C: No results for input(s): HGBA1C in the last 72 hours. CBG: Recent Labs  Lab 05/27/24 0458  GLUCAP 153*   Lipid Profile: No results for input(s): CHOL, HDL, LDLCALC, TRIG, CHOLHDL, LDLDIRECT in the last 72 hours. Thyroid Function Tests: Recent Labs    05/26/24 1254  TSH 0.929   Anemia Panel: No results for input(s): VITAMINB12, FOLATE, FERRITIN, TIBC, IRON, RETICCTPCT in the last 72 hours. Sepsis Labs: Recent Labs  Lab 05/26/24 1307 05/26/24 1649  LATICACIDVEN 1.4 1.0    Recent Results (from the past 240 hours)  Resp panel by RT-PCR (RSV, Flu A&B, Covid) Anterior Nasal Swab     Status: None   Collection Time: 05/23/24  1:23 PM    Specimen: Anterior Nasal Swab  Result Value Ref Range Status   SARS Coronavirus 2 by RT PCR NEGATIVE NEGATIVE Final   Influenza A by PCR NEGATIVE NEGATIVE Final   Influenza B by PCR NEGATIVE NEGATIVE Final    Comment: (NOTE) The Xpert Xpress SARS-CoV-2/FLU/RSV plus assay is intended as an aid in the diagnosis of influenza from Nasopharyngeal swab specimens and should not be used as a sole basis for treatment. Nasal washings and aspirates are unacceptable for Xpert Xpress SARS-CoV-2/FLU/RSV testing.  Fact Sheet for Patients: bloggercourse.com  Fact Sheet for Healthcare Providers: seriousbroker.it  This test is not yet approved or cleared by the [place]  FDA and has been authorized for detection and/or diagnosis of SARS-CoV-2 by FDA under an Emergency Use Authorization (EUA). This EUA will remain in effect (meaning this test can be used) for the duration of the COVID-19 declaration under  Section 564(b)(1) of the Act, 21 U.S.C. section 360bbb-3(b)(1), unless the authorization is terminated or revoked.     Resp Syncytial Virus by PCR NEGATIVE NEGATIVE Final    Comment: (NOTE) Fact Sheet for Patients: bloggercourse.com  Fact Sheet for Healthcare Providers: seriousbroker.it  This test is not yet approved or cleared by the [place]  FDA and has been authorized for detection and/or diagnosis of SARS-CoV-2 by FDA under an Emergency Use Authorization (EUA). This EUA will remain in effect (meaning this test can be used) for the duration of the COVID-19 declaration under Section 564(b)(1) of the Act, 21 U.S.C. section 360bbb-3(b)(1), unless the authorization is terminated or revoked.  Performed at [facility], [address], [place], [place] [address]   Urine Culture     Status: None   Collection Time: 05/23/24  8:02 PM   Specimen: Urine, Catheterized  Result Value  Ref Range Status   Specimen Description URINE, CATHETERIZED  Final   Special Requests NONE  Final   Culture   Final    NO GROWTH Performed at [facility], [address], [place], [place] [address]    Report Status 05/25/2024 FINAL  Final  Resp panel by RT-PCR (RSV, Flu A&B, Covid) Anterior Nasal Swab     Status: None   Collection Time: 05/27/24  3:14 AM   Specimen: Anterior Nasal Swab  Result Value Ref Range Status   SARS Coronavirus 2 by RT PCR NEGATIVE NEGATIVE Final   Influenza A by PCR NEGATIVE NEGATIVE Final   Influenza B by PCR NEGATIVE NEGATIVE Final    Comment: (NOTE) The Xpert Xpress SARS-CoV-2/FLU/RSV plus assay is intended as an aid in the diagnosis of influenza from Nasopharyngeal swab specimens and should not be used as a sole basis for treatment. Nasal washings and aspirates are unacceptable for Xpert Xpress SARS-CoV-2/FLU/RSV testing.  Fact Sheet for Patients: bloggercourse.com  Fact Sheet for Healthcare Providers: seriousbroker.it  This test is not yet approved or cleared by the [place]  FDA and has been authorized for detection and/or diagnosis of SARS-CoV-2 by FDA under an Emergency Use Authorization (EUA). This EUA will remain in effect (meaning this test can be used) for the duration of the COVID-19 declaration under Section 564(b)(1) of the Act, 21 U.S.C. section 360bbb-3(b)(1), unless the authorization is terminated or revoked.     Resp Syncytial Virus by PCR NEGATIVE NEGATIVE Final    Comment: (NOTE) Fact Sheet for Patients: bloggercourse.com  Fact Sheet for Healthcare Providers: seriousbroker.it  This test is not yet approved or cleared by the [place]  FDA and has been authorized for detection and/or diagnosis of SARS-CoV-2 by FDA under an Emergency Use Authorization (EUA). This EUA will remain in effect (meaning this test can be  used) for the duration of the COVID-19 declaration under Section 564(b)(1) of the Act, 21 U.S.C. section 360bbb-3(b)(1), unless the authorization is terminated or revoked.  Performed at [facility], [address], [place], [place] [address]          Radiology Studies: MR Brain W and Wo Contrast Result Date: 05/27/2024 EXAM: MRI BRAIN WITH AND WITHOUT CONTRAST 05/27/2024 12:41:32 PM TECHNIQUE: Multiplanar multisequence MRI of the head/brain was performed with and without the administration of 8 mL of gadobutrol  (GADAVIST ) 1 MMOL/ML injection. Examination is limited due to motion artifact. COMPARISON: CTA head and neck 05/26/2024. CLINICAL HISTORY: Brain metastases, unknown primary; R temporal mass. FINDINGS: BRAIN AND VENTRICLES: There is a 1.5 x 1.2 x 1.3 cm  enhancing mass in the lateral right temporal lobe corresponding to findings on CTA. There are additional smaller enhancing nodules suggestive of satellite lesions; the most pronounced such lesion is located posterior and superior to the dominant lesion and measures up to 1.0 cm (series 26, image 3). There is surrounding vasogenic edema throughout the right temporal lobe with associated mass effect and sulcal effacement. There is partial effacement of the right temporal horn and atrium of the right lateral ventricle. Approximately 6 mm leftward midline shift. There is a punctate focus of restricted diffusion in the right aspect of the splenium of corpus callosum concerning for small acute infarct (series 9, image 84). No significant associated edema. T2 and FLAIR hyperintensity in the periventricular and subcortical white matter suggestive of mild chronic microvascular ischemic changes. Possible small remote infarct in the right cerebellum. There is an additional region of edema within the anterior paramedial left frontal lobe (series 19, image 19). This region is obscured by motion artifact on the postcontrast images. On the sagittal  postcontrast images (series 26), this region is relatively free of artifact and there is no definite enhancement appreciated. Finding could reflect an additional metastatic lesion although no enhancing mass is appreciated on the current study. No acute intracranial hemorrhage. Normal flow voids. The sella is unremarkable. ORBITS: Bilateral lens replacement. SINUSES: No acute abnormality. BONES AND SOFT TISSUES: Normal bone marrow signal and enhancement. No acute soft tissue abnormality. IMPRESSION: 1. Enhancing mass in the lateral right temporal lobe measuring 1.5 x 1.2 x 1.3 cm, with additional smaller enhancing nodules suggestive of satellite lesions. Surrounding vasogenic edema and approximately 6 mm leftward midline shift. Findings suspicious for metastatic disease. 2. Additional region of edema within the anterior paramedial left frontal lobe, obscured by motion artifact on postcontrast images, concerning for additional metastatic lesion. No definite enhancement appreciated. 3. Punctate acute infarct in the right aspect of the splenium of corpus callosum. Electronically signed by: Donnice Mania MD 05/27/2024 01:11 PM EST RP Workstation: [id number]   DG Shoulder Left Port Result Date: 05/27/2024 EXAM: 1 VIEW(S) XRAY OF THE LEFT SHOULDER 05/27/2024 12:37:00 AM COMPARISON: None available. CLINICAL HISTORY: Left shoulder pain. FINDINGS: BONES AND JOINTS: Moderate glenohumeral joint space narrowing and osteophyte formation. Mild hypertrophic changes of the acromioclavicular joint. No acute fracture. SOFT TISSUES: No abnormal calcifications. Visualized lung is unremarkable. IMPRESSION: 1. No acute findings. 2. Moderate glenohumeral osteoarthritis. 3. Mild acromioclavicular joint osteoarthritis. Electronically signed by: Greig Pique MD 05/27/2024 01:16 AM EST RP Workstation: [id number]   CT ANGIO HEAD NECK W WO CM Result Date: 05/26/2024 EXAM: CTA HEAD AND NECK WITH AND WITHOUT CT HEAD WITHOUT CONTRAST  05/26/2024 06:03:00 PM TECHNIQUE: CT of the head was performed without the administration of intravenous contrast. CTA of the head and neck was performed with and without the administration of intravenous contrast. Multiplanar 2D and/or 3D reformatted images are provided for review. Automated exposure control, iterative reconstruction, and/or weight based adjustment of the mA/kV was utilized to reduce the radiation dose to as low as reasonably achievable. Stenosis of the internal carotid arteries measured using NASCET criteria. COMPARISON: None available CLINICAL HISTORY: Neuro deficit, acute, stroke suspected; Left arm and left leg weakness. Confusion. Recent fall. FINDINGS: Brain: Approximately 1.4 cm mass in the right temporal lobe with probable smaller adjacent mass, which is not well evaluated on this motion limited CT head. No obvious acute hemorrhage, large vascular territory infarct or hydrocephalus. Sinuses/mastoid: Clear. Calvarium: No acute fracture. AORTIC ARCH AND ARCH VESSELS: No dissection or arterial  injury. No significant stenosis of the brachiocephalic or subclavian arteries. CERVICAL CAROTID ARTERIES: Approximately 70% stenosis of the right ICA origin due to atherosclerosis. Left ICA is patent without significant stenosis. CERVICAL VERTEBRAL ARTERIES: No dissection, arterial injury, or significant stenosis. LUNGS AND MEDIASTINUM: Unremarkable. SOFT TISSUES: No acute abnormality. BONES: No acute abnormality. ANTERIOR CIRCULATION: No significant stenosis of the internal carotid arteries. No significant stenosis of the anterior cerebral arteries. No significant stenosis of the middle cerebral arteries. No aneurysm. POSTERIOR CIRCULATION: No significant stenosis of the posterior cerebral arteries. No significant stenosis of the basilar artery. No significant stenosis of the vertebral arteries. No aneurysm. OTHER: No dural venous sinus thrombosis on this non-dedicated study. IMPRESSION: 1.  Approximately 1.4 cm mass in the right temporal lobe with probable smaller adjacent mass, which are not well evaluated on this motion limited CT head. Surrounding edema and 4 mm leftward midline shift. Recommend MRI Head with contrast. 2. No large vessel occlusion. 3. Approximately 70% stenosis of the right ICA origin. Electronically signed by: Gilmore Molt 05/26/2024 07:26 PM EST RP Workstation: [id number]   CT CHEST ABDOMEN PELVIS W CONTRAST Result Date: 05/26/2024 EXAM: CT CHEST, ABDOMEN AND PELVIS WITH CONTRAST 05/26/2024 06:03:00 PM TECHNIQUE: CT of the chest, abdomen and pelvis was performed with the administration of 75 mL of iohexol  (OMNIPAQUE ) 350 MG/ML intravenous contrast. Multiplanar reformatted images are provided for review. Automated exposure control, iterative reconstruction, and/or weight based adjustment of the mA/kV was utilized to reduce the radiation dose to as low as reasonably achievable. COMPARISON: None available. CLINICAL HISTORY: Polytrauma, blunt; Hypoxia with recent fall, left hip pain. Traumatic injury or infection. FINDINGS: CHEST: MEDIASTINUM AND LYMPH NODES: Extensive multivessel coronary artery calcifications. Global cardiac size within normal limits. No pericardial effusion. The central airways are clear. No mediastinal, hilar or axillary lymphadenopathy. Central pulmonary arteries are of normal caliber. Mild atherosclerotic calcification within the thoracic aorta. No aortic aneurysm. LUNGS AND PLEURA: No focal consolidation or pulmonary edema. No pleural effusion or pneumothorax. ABDOMEN AND PELVIS: LIVER: Status post cholecystectomy. Nondependent pneumobilia suggests prior sphincterotomy. Liver otherwise unremarkable. GALLBLADDER AND BILE DUCTS: Status post cholecystectomy. Nondependent pneumobilia suggests prior sphincterotomy. No biliary ductal dilatation. SPLEEN: No acute abnormality. PANCREAS: No acute abnormality. ADRENAL GLANDS: No acute abnormality. KIDNEYS, URETERS  AND BLADDER: 4 mm nonobstructing calculus within the interpolar region of the right kidney. Simple exophytic cortical cyst arises from the upper pole of the right kidney for which a follow up imaging is recommended. Per consensus, no follow-up is needed for simple Bosniak type 1 and 2 renal cysts, unless the patient has a malignancy history or risk factors. No ureteral calculi. No hydronephrosis. No perinephric inflammatory stranding or fluid collections were identified. The bladder is unremarkable. GI AND BOWEL: Appendix normal. The stomach, small bowel, and large bowel are otherwise unremarkable. There is no bowel obstruction. REPRODUCTIVE ORGANS: Uterus absent. No adnexal masses. PERITONEUM AND RETROPERITONEUM: No ascites. No free air. VASCULATURE: Aorta is normal in caliber. Extensive aortoiliac atherosclerotic calcification. ABDOMINAL AND PELVIS LYMPH NODES: No lymphadenopathy. BONES AND SOFT TISSUES: Osseous structures are age appropriate. No acute bone abnormality. No lytic or blastic bone lesion. No focal soft tissue abnormality. IMPRESSION: 1. No acute findings in the chest, abdomen, or pelvis. 2. Extensive multivessel coronary artery calcifications. 3. Status post cholecystectomy with pneumobilia, likely related to prior sphincterotomy. 4. Prior hysterectomy. 5. 4 mm nonobstructing right renal calculus, without urolithiasis or hydronephrosis. 6. raf score includes Aortic atherosclerosis ([id number]). Electronically signed by: Dorethia Molt MD 05/26/2024 07:20  PM EST RP Workstation: [id number]   DG Chest Port 1 View Result Date: 05/26/2024 CLINICAL DATA:  Hypoxia. EXAM: PORTABLE CHEST 1 VIEW COMPARISON:  May 23, 2024 FINDINGS: The cardiac silhouette is mildly enlarged and unchanged in size. Low lung volumes are noted. Mild, stable diffusely increased interstitial lung markings are noted. Mild atelectasis and/or infiltrate is seen within the mid left lung. No pleural effusion or pneumothorax is  identified. The visualized skeletal structures are unremarkable. IMPRESSION: 1. Low lung volumes with mild mid left lung atelectasis and/or infiltrate. A mild superimposed component of interstitial edema cannot be excluded. Electronically Signed   By: Suzen Dials M.D.   On: 05/26/2024 17:22        Scheduled Meds:  dexamethasone  (DECADRON ) injection  4 mg Intravenous Q12H   heparin   5,000 Units Subcutaneous Q8H   levothyroxine   75 mcg Oral QAC breakfast   metoprolol  succinate  12.5 mg Oral BID   oxybutynin   5 mg Oral Daily   pantoprazole   40 mg Oral Daily   sodium chloride  flush  3 mL Intravenous Q12H   Continuous Infusions:     LOS: 1 day    Time spent: 45 minutes spent on chart review, discussion with nursing staff, consultants, updating family and interview/physical exam; more than 50% of that time was spent in counseling and/or coordination of care.    Harlene RAYMOND Bowl, DO [facility] Available via Epic secure chat 7am-7pm After these hours, please refer to coverage provider listed on [email or web address] 05/28/2024, 8:17 AM   "

## 2024-05-28 NOTE — Evaluation (Signed)
 Physical Therapy Evaluation Patient Details Name: [name] MRN: [id number] DOB: May 18, 1931 Today's Date: 05/28/2024  History of Present Illness  Pt is a 89 y.o. female who presented 05/26/24 due to AMS and L side weakness. MRI showed lateral right temporal lobe mass. Pending neurosurgery recommendations. PMH: Htn, GERD, hypothyroidism, chronic pain, falls.  Clinical Impression  Pt is currently presenting at 2 person Max A to Mod A for bed mobility, 2 person Mod A for sit to stand from EOB briefly. Pt has a high fear of falling and wanted to sit back down after standing. Due to pt current functional status, home set up and available assistance at home recommending skilled physical therapy services < 3 hours/day in order to address strength, balance and functional mobility to decrease risk for falls, injury, immobility, skin break down and re-hospitalization.        If plan is discharge home, recommend the following: A little help with walking and/or transfers;Assist for transportation;Help with stairs or ramp for entrance;Assistance with cooking/housework;Supervision due to cognitive status   Can travel by private vehicle   No    Equipment Recommendations Wheelchair (measurements PT);Hoyer lift;Hospital bed;Wheelchair cushion (measurements PT)     Functional Status Assessment Patient has had a recent decline in their functional status and demonstrates the ability to make significant improvements in function in a reasonable and predictable amount of time.     Precautions / Restrictions Precautions Precautions: Fall Recall of Precautions/Restrictions: Impaired Restrictions Weight Bearing Restrictions Per Provider Order: No      Mobility  Bed Mobility Overal bed mobility: Needs Assistance Bed Mobility: Supine to Sit, Sit to Supine     Supine to sit: Max assist, +2 for physical assistance Sit to supine: Mod assist, HOB elevated, Used rails, +2 for physical assistance    General bed mobility comments: heavy cues especially for initiation for movement. Pt was able to assist getting her RLE up onto the bed from sitting.    Transfers Overall transfer level: Needs assistance Equipment used: 2 person hand held assist Transfers: Sit to/from Stand Sit to Stand: Mod assist, +2 physical assistance, +2 safety/equipment           General transfer comment: use of bed pad for initiation of initial momentum and sequencing. Pt with high fear of falling. 2 person Mod A for stabilization in standing.    Ambulation/Gait     General Gait Details: unable at this time.      Balance Overall balance assessment: Needs assistance Sitting-balance support: Feet supported Sitting balance-Leahy Scale: Fair Sitting balance - Comments: CGA for safety, no overt LOB in sitting. Postural control: Left lateral lean, Posterior lean       Pertinent Vitals/Pain Pain Assessment Pain Assessment: Faces Faces Pain Scale: Hurts little more Breathing: normal Negative Vocalization: occasional moan/groan, low speech, negative/disapproving quality Facial Expression: smiling or inexpressive Body Language: tense, distressed pacing, fidgeting Consolability: distracted or reassured by voice/touch PAINAD Score: 3 Pain Location: with mobility; seems like back but unclear and pt unable to state Pain Descriptors / Indicators: Grimacing, Guarding Pain Intervention(s): Limited activity within patient's tolerance, Monitored during session, Repositioned    Home Living Family/patient expects to be discharged to:: Skilled nursing facility   Additional Comments: Per report ambulating with RW but pt was unable to give more PLOF. Per chart review was living alone in [facility] and drive up from [facility] to visit sister.    Prior Function Prior Level of Function : Independent/Modified Independent  Extremity/Trunk Assessment   Upper Extremity Assessment Upper Extremity Assessment: Defer to OT  evaluation    Lower Extremity Assessment Lower Extremity Assessment: Difficult to assess due to impaired cognition    Cervical / Trunk Assessment Cervical / Trunk Assessment: Kyphotic  Communication   Communication Communication: Impaired Factors Affecting Communication: Reduced clarity of speech;Difficulty expressing self    Cognition Arousal: Alert Behavior During Therapy: Flat affect   PT - Cognitive impairments: No family/caregiver present to determine baseline, Orientation, Awareness, Memory, Attention, Initiation, Sequencing, Problem solving, Safety/Judgement   Orientation impairments: Place, Time, Situation     Following commands: Impaired Following commands impaired: Follows one step commands inconsistently     Cueing Cueing Techniques: Verbal cues, Gestural cues, Tactile cues, Visual cues            Assessment/Plan    PT Assessment Patient needs continued PT services  PT Problem List Decreased strength;Decreased activity tolerance;Decreased balance;Decreased mobility;Pain;Decreased safety awareness       PT Treatment Interventions DME instruction;Therapeutic exercise;Gait training;Balance training;Neuromuscular re-education;Functional mobility training;Therapeutic activities;Patient/family education    PT Goals (Current goals can be found in the Care Plan section)  Acute Rehab PT Goals PT Goal Formulation: Patient unable to participate in goal setting Time For Goal Achievement: 06/11/24 Potential to Achieve Goals: Fair    Frequency Min 1X/week        AM-PAC PT 6 Clicks Mobility  Outcome Measure Help needed turning from your back to your side while in a flat bed without using bedrails?: A Lot Help needed moving from lying on your back to sitting on the side of a flat bed without using bedrails?: A Lot Help needed moving to and from a bed to a chair (including a wheelchair)?: Total Help needed standing up from a chair using your arms (e.g.,  wheelchair or bedside chair)?: Total Help needed to walk in hospital room?: Total Help needed climbing 3-5 steps with a railing? : Total 6 Click Score: 8    End of Session Equipment Utilized During Treatment: Gait belt Activity Tolerance: Patient tolerated treatment well;Patient limited by pain Patient left: in bed;with call bell/phone within reach;with bed alarm set Nurse Communication: Mobility status PT Visit Diagnosis: Unsteadiness on feet (R26.81);Other abnormalities of gait and mobility (R26.89);Muscle weakness (generalized) (M62.81);Pain Pain - Right/Left:  (unclear I think back)    Time: 8948-8884 PT Time Calculation (min) (ACUTE ONLY): 24 min   Charges:   PT Evaluation $PT Eval Low Complexity: 1 Low PT Treatments $Therapeutic Activity: 8-22 mins PT General Charges $$ ACUTE PT VISIT: 1 Visit        [name], DPT, CLT  Acute Rehabilitation Services Office: [phone] (Secure chat preferred)   [name] 05/28/2024, 11:35 AM

## 2024-05-28 NOTE — Plan of Care (Signed)
  Problem: Clinical Measurements: Goal: Diagnostic test results will improve Outcome: Progressing Goal: Respiratory complications will improve Outcome: Progressing Goal: Cardiovascular complication will be avoided Outcome: Progressing   Problem: Activity: Goal: Risk for activity intolerance will decrease Outcome: Progressing   Problem: Nutrition: Goal: Adequate nutrition will be maintained Outcome: Progressing   Problem: Coping: Goal: Level of anxiety will decrease Outcome: Progressing   Problem: Elimination: Goal: Will not experience complications related to bowel motility Outcome: Progressing Goal: Will not experience complications related to urinary retention Outcome: Progressing   Problem: Pain Managment: Goal: General experience of comfort will improve and/or be controlled Outcome: Progressing   Problem: Safety: Goal: Ability to remain free from injury will improve Outcome: Progressing   Problem: Skin Integrity: Goal: Risk for impaired skin integrity will decrease Outcome: Progressing

## 2024-05-28 NOTE — Progress Notes (Signed)
 Subjective: C/o mild headache  Objective: Vital signs in last 24 hours: Temp:  [97.4 F (36.3 C)-98.1 F (36.7 C)] 98.1 F (36.7 C) (12/29 0749) Pulse Rate:  [70-91] 70 (12/29 0749) Resp:  [15-20] 18 (12/29 0749) BP: (110-133)/(46-62) 110/55 (12/29 0749) SpO2:  [95 %-100 %] 95 % (12/29 0749)  Intake/Output from previous day: 12/28 0701 - 12/29 0700 In: 662.7 [I.V.:662.7] Out: -  Intake/Output this shift: No intake/output data recorded.  NAD Alert, oriented to person only Left pronator drift  Lab Results: Recent Labs    05/26/24 1254 05/26/24 1302 05/27/24 0314  WBC 14.1*  --  8.0  HGB 12.8 12.2  12.2 12.1  HCT 37.8 36.0  36.0 35.9*  PLT 196  --  158   BMET Recent Labs    05/26/24 1254 05/26/24 1302 05/27/24 0314  NA 140 140  141 138  K 3.9 3.8  3.8 3.9  CL 102 101 102  CO2 29  --  26  GLUCOSE 105* 103* 163*  BUN 11 11 14   CREATININE 1.15* 1.20* 0.92  CALCIUM  9.5  --  9.2    Studies/Results: MR Brain W and Wo Contrast Result Date: 05/27/2024 EXAM: MRI BRAIN WITH AND WITHOUT CONTRAST 05/27/2024 12:41:32 PM TECHNIQUE: Multiplanar multisequence MRI of the head/brain was performed with and without the administration of 8 mL of gadobutrol  (GADAVIST ) 1 MMOL/ML injection. Examination is limited due to motion artifact. COMPARISON: CTA head and neck 05/26/2024. CLINICAL HISTORY: Brain metastases, unknown primary; R temporal mass. FINDINGS: BRAIN AND VENTRICLES: There is a 1.5 x 1.2 x 1.3 cm enhancing mass in the lateral right temporal lobe corresponding to findings on CTA. There are additional smaller enhancing nodules suggestive of satellite lesions; the most pronounced such lesion is located posterior and superior to the dominant lesion and measures up to 1.0 cm (series 26, image 3). There is surrounding vasogenic edema throughout the right temporal lobe with associated mass effect and sulcal effacement. There is partial effacement of the right temporal horn and  atrium of the right lateral ventricle. Approximately 6 mm leftward midline shift. There is a punctate focus of restricted diffusion in the right aspect of the splenium of corpus callosum concerning for small acute infarct (series 9, image 84). No significant associated edema. T2 and FLAIR hyperintensity in the periventricular and subcortical white matter suggestive of mild chronic microvascular ischemic changes. Possible small remote infarct in the right cerebellum. There is an additional region of edema within the anterior paramedial left frontal lobe (series 19, image 19). This region is obscured by motion artifact on the postcontrast images. On the sagittal postcontrast images (series 26), this region is relatively free of artifact and there is no definite enhancement appreciated. Finding could reflect an additional metastatic lesion although no enhancing mass is appreciated on the current study. No acute intracranial hemorrhage. Normal flow voids. The sella is unremarkable. ORBITS: Bilateral lens replacement. SINUSES: No acute abnormality. BONES AND SOFT TISSUES: Normal bone marrow signal and enhancement. No acute soft tissue abnormality. IMPRESSION: 1. Enhancing mass in the lateral right temporal lobe measuring 1.5 x 1.2 x 1.3 cm, with additional smaller enhancing nodules suggestive of satellite lesions. Surrounding vasogenic edema and approximately 6 mm leftward midline shift. Findings suspicious for metastatic disease. 2. Additional region of edema within the anterior paramedial left frontal lobe, obscured by motion artifact on postcontrast images, concerning for additional metastatic lesion. No definite enhancement appreciated. 3. Punctate acute infarct in the right aspect of the splenium of corpus callosum.  Electronically signed by: Donnice Mania MD 05/27/2024 01:11 PM EST RP Workstation: [id number]   DG Shoulder Left Port Result Date: 05/27/2024 EXAM: 1 VIEW(S) XRAY OF THE LEFT SHOULDER 05/27/2024  12:37:00 AM COMPARISON: None available. CLINICAL HISTORY: Left shoulder pain. FINDINGS: BONES AND JOINTS: Moderate glenohumeral joint space narrowing and osteophyte formation. Mild hypertrophic changes of the acromioclavicular joint. No acute fracture. SOFT TISSUES: No abnormal calcifications. Visualized lung is unremarkable. IMPRESSION: 1. No acute findings. 2. Moderate glenohumeral osteoarthritis. 3. Mild acromioclavicular joint osteoarthritis. Electronically signed by: Greig Pique MD 05/27/2024 01:16 AM EST RP Workstation: [id number]   CT ANGIO HEAD NECK W WO CM Result Date: 05/26/2024 EXAM: CTA HEAD AND NECK WITH AND WITHOUT CT HEAD WITHOUT CONTRAST 05/26/2024 06:03:00 PM TECHNIQUE: CT of the head was performed without the administration of intravenous contrast. CTA of the head and neck was performed with and without the administration of intravenous contrast. Multiplanar 2D and/or 3D reformatted images are provided for review. Automated exposure control, iterative reconstruction, and/or weight based adjustment of the mA/kV was utilized to reduce the radiation dose to as low as reasonably achievable. Stenosis of the internal carotid arteries measured using NASCET criteria. COMPARISON: None available CLINICAL HISTORY: Neuro deficit, acute, stroke suspected; Left arm and left leg weakness. Confusion. Recent fall. FINDINGS: Brain: Approximately 1.4 cm mass in the right temporal lobe with probable smaller adjacent mass, which is not well evaluated on this motion limited CT head. No obvious acute hemorrhage, large vascular territory infarct or hydrocephalus. Sinuses/mastoid: Clear. Calvarium: No acute fracture. AORTIC ARCH AND ARCH VESSELS: No dissection or arterial injury. No significant stenosis of the brachiocephalic or subclavian arteries. CERVICAL CAROTID ARTERIES: Approximately 70% stenosis of the right ICA origin due to atherosclerosis. Left ICA is patent without significant stenosis. CERVICAL VERTEBRAL  ARTERIES: No dissection, arterial injury, or significant stenosis. LUNGS AND MEDIASTINUM: Unremarkable. SOFT TISSUES: No acute abnormality. BONES: No acute abnormality. ANTERIOR CIRCULATION: No significant stenosis of the internal carotid arteries. No significant stenosis of the anterior cerebral arteries. No significant stenosis of the middle cerebral arteries. No aneurysm. POSTERIOR CIRCULATION: No significant stenosis of the posterior cerebral arteries. No significant stenosis of the basilar artery. No significant stenosis of the vertebral arteries. No aneurysm. OTHER: No dural venous sinus thrombosis on this non-dedicated study. IMPRESSION: 1. Approximately 1.4 cm mass in the right temporal lobe with probable smaller adjacent mass, which are not well evaluated on this motion limited CT head. Surrounding edema and 4 mm leftward midline shift. Recommend MRI Head with contrast. 2. No large vessel occlusion. 3. Approximately 70% stenosis of the right ICA origin. Electronically signed by: Gilmore Molt 05/26/2024 07:26 PM EST RP Workstation: [id number]   CT CHEST ABDOMEN PELVIS W CONTRAST Result Date: 05/26/2024 EXAM: CT CHEST, ABDOMEN AND PELVIS WITH CONTRAST 05/26/2024 06:03:00 PM TECHNIQUE: CT of the chest, abdomen and pelvis was performed with the administration of 75 mL of iohexol  (OMNIPAQUE ) 350 MG/ML intravenous contrast. Multiplanar reformatted images are provided for review. Automated exposure control, iterative reconstruction, and/or weight based adjustment of the mA/kV was utilized to reduce the radiation dose to as low as reasonably achievable. COMPARISON: None available. CLINICAL HISTORY: Polytrauma, blunt; Hypoxia with recent fall, left hip pain. Traumatic injury or infection. FINDINGS: CHEST: MEDIASTINUM AND LYMPH NODES: Extensive multivessel coronary artery calcifications. Global cardiac size within normal limits. No pericardial effusion. The central airways are clear. No mediastinal, hilar or  axillary lymphadenopathy. Central pulmonary arteries are of normal caliber. Mild atherosclerotic calcification within the thoracic aorta. No aortic  aneurysm. LUNGS AND PLEURA: No focal consolidation or pulmonary edema. No pleural effusion or pneumothorax. ABDOMEN AND PELVIS: LIVER: Status post cholecystectomy. Nondependent pneumobilia suggests prior sphincterotomy. Liver otherwise unremarkable. GALLBLADDER AND BILE DUCTS: Status post cholecystectomy. Nondependent pneumobilia suggests prior sphincterotomy. No biliary ductal dilatation. SPLEEN: No acute abnormality. PANCREAS: No acute abnormality. ADRENAL GLANDS: No acute abnormality. KIDNEYS, URETERS AND BLADDER: 4 mm nonobstructing calculus within the interpolar region of the right kidney. Simple exophytic cortical cyst arises from the upper pole of the right kidney for which a follow up imaging is recommended. Per consensus, no follow-up is needed for simple Bosniak type 1 and 2 renal cysts, unless the patient has a malignancy history or risk factors. No ureteral calculi. No hydronephrosis. No perinephric inflammatory stranding or fluid collections were identified. The bladder is unremarkable. GI AND BOWEL: Appendix normal. The stomach, small bowel, and large bowel are otherwise unremarkable. There is no bowel obstruction. REPRODUCTIVE ORGANS: Uterus absent. No adnexal masses. PERITONEUM AND RETROPERITONEUM: No ascites. No free air. VASCULATURE: Aorta is normal in caliber. Extensive aortoiliac atherosclerotic calcification. ABDOMINAL AND PELVIS LYMPH NODES: No lymphadenopathy. BONES AND SOFT TISSUES: Osseous structures are age appropriate. No acute bone abnormality. No lytic or blastic bone lesion. No focal soft tissue abnormality. IMPRESSION: 1. No acute findings in the chest, abdomen, or pelvis. 2. Extensive multivessel coronary artery calcifications. 3. Status post cholecystectomy with pneumobilia, likely related to prior sphincterotomy. 4. Prior hysterectomy.  5. 4 mm nonobstructing right renal calculus, without urolithiasis or hydronephrosis. 6. raf score includes Aortic atherosclerosis (icd10-i70.0). Electronically signed by: Dorethia Molt MD 05/26/2024 07:20 PM EST RP Workstation: [id number]   DG Chest Port 1 View Result Date: 05/26/2024 CLINICAL DATA:  Hypoxia. EXAM: PORTABLE CHEST 1 VIEW COMPARISON:  May 23, 2024 FINDINGS: The cardiac silhouette is mildly enlarged and unchanged in size. Low lung volumes are noted. Mild, stable diffusely increased interstitial lung markings are noted. Mild atelectasis and/or infiltrate is seen within the mid left lung. No pleural effusion or pneumothorax is identified. The visualized skeletal structures are unremarkable. IMPRESSION: 1. Low lung volumes with mild mid left lung atelectasis and/or infiltrate. A mild superimposed component of interstitial edema cannot be excluded. Electronically Signed   By: Suzen Dials M.D.   On: 05/26/2024 17:22    Assessment/Plan: This is a 89 year old woman with a right temporal mass concerning for metastatic disease versus primary malignant brain tumor.  Nonneoplastic lesions such as infection, inflammatory lesion, demyelinating lesion are unlikely. - I personally reviewed the MRI, which is limited due to patient motion but was still informative diagnostically.  Had a long discussion with the patient's sister who is her power of attorney.  I discussed the presumed diagnosis as well as treatment options.  Option of brain surgery for establishing diagnosis was discussed.  However, her performance status is poor and she appears to be a poor candidate for any chemotherapy or radiation.  The patient and sister do not want any chemotherapy or radiation treatments.  As such, she is an appropriate candidate for palliative care.  The sister does wish to pursue this for her.  All questions and concerns were answered.  Please call us  with questions.  Dorn KANDICE Ned 05/28/2024, 1:29  PM

## 2024-05-28 NOTE — Evaluation (Signed)
 Occupational Therapy Evaluation Patient Details Name: [name] MRN: [id number] DOB: [birth year] Today's Date: 05/28/2024   History of Present Illness   Pt is a 89 y.o. female who presented 05/26/24 due to AMS and L side weakness. MRI showed lateral right temporal lobe . Pending neurosurgery recommendations. PMH: Htn, GERD, hypothyroidism, chronic pain, falls.     Clinical Impressions Per chart review pt was living alone in [facility] with a nurse that comes every 2 weeks. She drive to [facility] to visit her sister for Christmas. At this time the pt was was able to report her name and month/day of her DOB. With increase in time, max cues and mod-max assist was able to go from sitting to supine but then when at EOB she kept attempting to go back into supine. Pt requires mod-max with UE ADLS and max-total for LE at bed level. Patient will benefit from continued inpatient follow up therapy, <3 hours/day.      If plan is discharge home, recommend the following:   Two people to help with walking and/or transfers;Two people to help with bathing/dressing/bathroom;Assistance with cooking/housework;Assistance with feeding;Direct supervision/assist for medications management;Direct supervision/assist for financial management;Assist for transportation;Help with stairs or ramp for entrance;Supervision due to cognitive status     Functional Status Assessment   Patient has had a recent decline in their functional status and demonstrates the ability to make significant improvements in function in a reasonable and predictable amount of time.     Equipment Recommendations    (TBD)     Recommendations for Other Services         Precautions/Restrictions   Precautions Precautions: Fall Recall of Precautions/Restrictions: Impaired Restrictions Weight Bearing Restrictions Per Provider Order: No     Mobility Bed Mobility Overal bed mobility: Needs Assistance Bed Mobility: Supine to Sit, Sit  to Supine     Supine to sit: Mod assist, Max assist, HOB elevated, Used rails Sit to supine: Mod assist, HOB elevated, Used rails   General bed mobility comments: pt needed max cues to sequence and mod-max assist tactile cues at hips    Transfers                   General transfer comment: deffered      Balance Overall balance assessment: Needs assistance Sitting-balance support: Feet supported Sitting balance-Leahy Scale: Poor Sitting balance - Comments: fair-poor, pt needed cued on how to position self and then started to lay back down Postural control: Left lateral lean, Posterior lean                                 ADL either performed or assessed with clinical judgement   ADL Overall ADL's : Needs assistance/impaired     Grooming: Wash/dry face;Minimal assistance;Moderate assistance;Bed level   Upper Body Bathing: Moderate assistance;Maximal assistance;Bed level   Lower Body Bathing: Total assistance;Bed level   Upper Body Dressing : Moderate assistance;Maximal assistance;Bed level   Lower Body Dressing: Maximal assistance;Total assistance;Bed level       Toileting- Clothing Manipulation and Hygiene: Total assistance;Bed level               Vision   Vision Assessment?: Vision impaired- to be further tested in functional context (pt often given blank stare in session)     Perception         Praxis         Pertinent Vitals/Pain Pain Assessment Pain Assessment:  Faces Faces Pain Scale: Hurts little more Breathing: normal Negative Vocalization: occasional moan/groan, low speech, negative/disapproving quality Facial Expression: smiling or inexpressive Body Language: relaxed Consolability: no need to console PAINAD Score: 1 Pain Location: When attempting to move within bed seemed to be more related to RLE Pain Descriptors / Indicators: Grimacing, Guarding Pain Intervention(s): Limited activity within patient's tolerance,  Monitored during session, Repositioned     Extremity/Trunk Assessment Upper Extremity Assessment Upper Extremity Assessment: Difficult to assess due to impaired cognition (noted decrease in AROMin LUE compared to RUE. She was able to grasp in LUE but when attempting to complete further had difficulties intiation.)   Lower Extremity Assessment Lower Extremity Assessment: Defer to PT evaluation   Cervical / Trunk Assessment Cervical / Trunk Assessment: Kyphotic   Communication Communication Communication: Impaired Factors Affecting Communication: Reduced clarity of speech;Difficulty expressing self   Cognition Arousal: Alert Behavior During Therapy: Flat affect Cognition: Cognition impaired   Orientation impairments: Place, Time, Situation Awareness: Intellectual awareness impaired, Online awareness impaired Memory impairment (select all impairments): Short-term memory, Working civil service fast streamer, Non-declarative long-term memory, Geneticist, Molecular long-term memory Attention impairment (select first level of impairment): Sustained attention Executive functioning impairment (select all impairments): Initiation, Organization, Sequencing, Reasoning, Problem solving OT - Cognition Comments: Pt able to report name and month/day for DOB. Pt could not recall in session about where they are after informing the pt.                 Following commands: Impaired Following commands impaired: Follows one step commands inconsistently     Cueing  General Comments   Cueing Techniques: Verbal cues;Gestural cues;Tactile cues;Visual cues      Exercises     Shoulder Instructions      Home Living Family/patient expects to be discharged to:: Private residence                                 Additional Comments: Per report ambulating with RW but pt was unable to give more PLOF. Per chart review was living alone in [facility] and drive up from [facility] to visit sister.      Prior Functioning/Environment                       OT Problem List: Decreased strength;Impaired balance (sitting and/or standing);Decreased safety awareness;Decreased knowledge of use of DME or AE;Pain   OT Treatment/Interventions: Self-care/ADL training;Therapeutic exercise;DME and/or AE instruction;Therapeutic activities;Patient/family education;Balance training      OT Goals(Current goals can be found in the care plan section)   Acute Rehab OT Goals Patient Stated Goal: none OT Goal Formulation: Patient unable to participate in goal setting Time For Goal Achievement: 06/12/24 Potential to Achieve Goals: Fair   OT Frequency:  Min 2X/week    Co-evaluation              AM-PAC OT 6 Clicks Daily Activity     Outcome Measure Help from another person eating meals?: A Little Help from another person taking care of personal grooming?: A Lot Help from another person toileting, which includes using toliet, bedpan, or urinal?: Total Help from another person bathing (including washing, rinsing, drying)?: A Lot Help from another person to put on and taking off regular upper body clothing?: A Lot Help from another person to put on and taking off regular lower body clothing?: Total 6 Click Score: 11   End of Session Nurse Communication: Mobility status  Activity Tolerance: Patient limited by fatigue Patient left: in bed;with call bell/phone within reach;with bed alarm set  OT Visit Diagnosis: Unsteadiness on feet (R26.81);Other abnormalities of gait and mobility (R26.89);Muscle weakness (generalized) (M62.81);Pain Pain - Right/Left:  (general)                Time: 9154-9083 OT Time Calculation (min): 31 min Charges:  OT General Charges $OT Visit: 1 Visit OT Evaluation $OT Eval Low Complexity: 1 Low OT Treatments $Self Care/Home Management : 8-22 mins  Warrick POUR OTR/L  Acute Rehab Services  [phone] office number   Warrick Berber 05/28/2024, 9:36 AM
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
## 2024-05-29 DIAGNOSIS — J189 Pneumonia, unspecified organism: Secondary | ICD-10-CM | POA: Diagnosis not present

## 2024-05-29 DIAGNOSIS — G9389 Other specified disorders of brain: Secondary | ICD-10-CM | POA: Diagnosis not present

## 2024-05-29 DIAGNOSIS — R531 Weakness: Secondary | ICD-10-CM | POA: Diagnosis not present

## 2024-05-29 DIAGNOSIS — Z515 Encounter for palliative care: Secondary | ICD-10-CM | POA: Diagnosis not present

## 2024-05-29 DIAGNOSIS — R638 Other symptoms and signs concerning food and fluid intake: Secondary | ICD-10-CM | POA: Diagnosis not present

## 2024-05-29 DIAGNOSIS — Z7189 Other specified counseling: Secondary | ICD-10-CM | POA: Diagnosis not present

## 2024-05-29 LAB — LIPID PANEL
Cholesterol: 179 mg/dL (ref 0–200)
HDL: 35 mg/dL — ABNORMAL LOW
LDL Cholesterol: 111 mg/dL — ABNORMAL HIGH (ref 0–99)
Total CHOL/HDL Ratio: 5.1 ratio
Triglycerides: 165 mg/dL — ABNORMAL HIGH
VLDL: 33 mg/dL (ref 0–40)

## 2024-05-29 LAB — HEMOGLOBIN A1C
Hgb A1c MFr Bld: 5.6 % (ref 4.8–5.6)
Mean Plasma Glucose: 114.02 mg/dL

## 2024-05-29 MED ORDER — HALOPERIDOL LACTATE 2 MG/ML PO CONC: 0.5000 mg | ORAL | Status: DC | PRN

## 2024-05-29 MED ORDER — HALOPERIDOL LACTATE 5 MG/ML IJ SOLN: 0.5000 mg | INTRAMUSCULAR | Status: DC | PRN

## 2024-05-29 MED ORDER — MORPHINE SULFATE (PF) 2 MG/ML IV SOLN
1.0000 mg | INTRAVENOUS | Status: DC | PRN
  Administered 2024-06-06: 4 mg via INTRAVENOUS
  Filled 2024-05-29: qty 2

## 2024-05-29 MED ORDER — LORAZEPAM 2 MG/ML IJ SOLN
1.0000 mg | INTRAMUSCULAR | Status: DC | PRN
  Administered 2024-05-29 – 2024-06-01 (×4): 1 mg via INTRAVENOUS
  Filled 2024-05-29 (×4): qty 1

## 2024-05-29 MED ORDER — POLYVINYL ALCOHOL 1.4 % OP SOLN: 1.0000 [drp] | Freq: Four times a day (QID) | OPHTHALMIC | Status: DC | PRN

## 2024-05-29 MED ORDER — GLYCOPYRROLATE 0.2 MG/ML IJ SOLN: 0.2000 mg | INTRAMUSCULAR | Status: DC | PRN

## 2024-05-29 MED ORDER — BIOTENE DRY MOUTH MT LIQD: 15.0000 mL | OROMUCOSAL | Status: DC | PRN

## 2024-05-29 MED ORDER — LORAZEPAM 2 MG/ML PO CONC: 1.0000 mg | ORAL | Status: DC | PRN

## 2024-05-29 MED ORDER — HALOPERIDOL 0.5 MG PO TABS: 0.5000 mg | ORAL_TABLET | ORAL | Status: DC | PRN

## 2024-05-29 MED ORDER — PROCHLORPERAZINE EDISYLATE 10 MG/2ML IJ SOLN
10.0000 mg | Freq: Four times a day (QID) | INTRAMUSCULAR | Status: DC | PRN
  Administered 2024-06-01 – 2024-06-09 (×8): 10 mg via INTRAVENOUS
  Filled 2024-05-29 (×10): qty 2

## 2024-05-29 MED ORDER — GLYCOPYRROLATE 1 MG PO TABS: 1.0000 mg | ORAL_TABLET | ORAL | Status: DC | PRN

## 2024-05-29 MED ORDER — LIDOCAINE 5 % EX PTCH
1.0000 | MEDICATED_PATCH | CUTANEOUS | Status: DC
  Administered 2024-05-30 – 2024-06-11 (×10): 1 via TRANSDERMAL
  Filled 2024-05-29 (×12): qty 1

## 2024-05-29 MED ORDER — LORAZEPAM 1 MG PO TABS: 1.0000 mg | ORAL_TABLET | ORAL | Status: DC | PRN

## 2024-05-29 NOTE — Consult Note (Signed)
 "                                                  Palliative Care Consult Note                                  Date: 05/29/2024   Patient Name: [name]  DOB: 1930/12/02  MRN: [id number]  Age / Sex: 89 y.o., female  PCP: Pcp, No Referring Physician: Juvenal Harlene PENNER, DO  Reason for Consultation: Establishing goals of care  HPI/Patient Profile: 89 y.o. female  with past medical history of hypertension, hypothyroidism, GERD, chronic pain, and recurrent falls who presented to the ED on 05/26/2024 with altered mental status and new left-sided weakness.  She was found to have a right temporal mass concerning for metastatic disease versus primary malignant brain tumor.  Palliative Medicine has been consulted for goals of care discussions and complex medical decision making.   Clinical Assessment and Goals of Care:   Extensive chart review has been completed including labs, vital signs, imaging, progress/consult notes, orders, medications and available advance directive documents.    Update received from RN. Patient has been refusing care today, and moans/yells with any attempts to reposition. RN reports minimal to no PO intake.  Patient assessed. She is pleasantly confused; oriented to self only. She reports pain all over.   I met with her sister/Carol at bedside to discuss diagnosis, prognosis, GOC, EOL wishes, disposition, and options.  I introduced Palliative Medicine as specialized medical care for people living with serious illness. It focuses on providing relief from the symptoms and stress of a serious illness.   Created space and opportunity for patient and sister to express thoughts and feelings regarding current medical situation. Values and goals of care were attempted to be elicited.  Social History: Niels shares that she is her sister's only living relative. At baseline, patient is cognitively intact and ambulatory with a walker. She recently drove from [facility] [place]  to spend Christmas with Niels in [place].   Discussion: We discussed patient's current medical situation and what it means in the larger context of her ongoing co-morbidities. Current clinical status was reviewed.   Niels understands her sister has a brain mass, and confirms that they are not interested in further work-up or cancer-directed treatment.   The difference between full scope medical intervention and comfort care was considered.  I introduced the concept of a comfort path, as de-escalating full scope medical interventions and allowing a natural course to occur. Discussed that the goal is comfort and dignity rather than cure/prolonging life.   Niels is clear that she wants her sister to be comfortable. Discussed transitioning to comfort care in the hospital, and what that would look like--keeping her clean and dry, no labs, no artificial hydration or feeding, no antibiotics, minimizing of medications, comfort feeds, and medication for pain as needed.   Discussed natural trajectory at end-of-life. Discussed option for transition to inpatient hospice facility, and Niels expresses interest in [facility].   Questions and concerns addressed. Emotional support provided.  Review of Systems  Constitutional:  Positive for appetite change.  Gastrointestinal:  Positive for nausea.  Musculoskeletal:        Generalized pain    Objective:   Primary Diagnoses:  Present on Admission:  Anxiety state  Hypothyroidism   Physical Exam Vitals reviewed.  Constitutional:      General: She is awake. She is not in acute distress.    Appearance: She is ill-appearing.  Pulmonary:     Effort: Pulmonary effort is normal.  Neurological:     Mental Status: She is confused.     Motor: Weakness present.     Palliative Assessment/Data: PPS 20%     Assessment & Plan:   SUMMARY OF RECOMMENDATIONS   Transition to comfort care Referral to inpatient hospice - sister requests [facility] Continue IV dexamethasone  until discharge Ongoing PMT support  Symptom Management:  Morphine  prn for pain or dyspnea Lorazepam  (ATIVAN ) prn for anxiety Haloperidol (HALDOL) prn for agitation  Glycopyrrolate (ROBINUL) for excessive secretions Ondansetron  (ZOFRAN ) prn for nausea Polyvinyl alcohol (LIQUIFILM TEARS) prn for dry eyes Antiseptic oral rinse (BIOTENE) prn for dry mouth   Primary Decision Maker: NEXT OF KIN - sister/Carol  Code Status/Advance Care Planning: DNR  Prognosis:  < 2 weeks  Discharge Planning:  To Be Determined   Discussed with: Dr. Juvenal, RN, TOC, hospice liaison   Time Total: 76 minutes  Detailed review of medical records (labs, imaging, vital signs), medically appropriate exam, discussed with treatment team, counseling and education to patient, family, & staff, documenting clinical information, medication management, coordination of care.   Signed by: Recardo Loll, NP Palliative Medicine Team  Team Phone # [phone]  For individual providers, please see AMION                "

## 2024-05-29 NOTE — Plan of Care (Signed)
" °  Problem: Clinical Measurements: Goal: Diagnostic test results will improve Outcome: Progressing Goal: Respiratory complications will improve Outcome: Progressing Goal: Cardiovascular complication will be avoided Outcome: Progressing   Problem: Activity: Goal: Risk for activity intolerance will decrease Outcome: Progressing   Problem: Nutrition: Goal: Adequate nutrition will be maintained Outcome: Progressing   Problem: Coping: Goal: Level of anxiety will decrease Outcome: Progressing   Problem: Elimination: Goal: Will not experience complications related to bowel motility Outcome: Progressing Goal: Will not experience complications related to urinary retention Outcome: Progressing   Problem: Pain Managment: Goal: General experience of comfort will improve and/or be controlled Outcome: Progressing   Problem: Safety: Goal: Ability to remain free from injury will improve Outcome: Progressing   Problem: Skin Integrity: Goal: Risk for impaired skin integrity will decrease Outcome: Progressing   Problem: Education: Goal: Knowledge of disease or condition will improve Outcome: Progressing Goal: Knowledge of secondary prevention will improve (MUST DOCUMENT ALL) Outcome: Progressing Goal: Knowledge of patient specific risk factors will improve (DELETE if not current risk factor) Outcome: Progressing   Problem: Ischemic Stroke/TIA Tissue Perfusion: Goal: Complications of ischemic stroke/TIA will be minimized Outcome: Progressing   Problem: Coping: Goal: Will verbalize positive feelings about self Outcome: Progressing Goal: Will identify appropriate support needs Outcome: Progressing   Problem: Health Behavior/Discharge Planning: Goal: Ability to manage health-related needs will improve Outcome: Progressing Goal: Goals will be collaboratively established with patient/family Outcome: Progressing   Problem: Self-Care: Goal: Ability to participate in self-care as  condition permits will improve Outcome: Progressing Goal: Verbalization of feelings and concerns over difficulty with self-care will improve Outcome: Progressing Goal: Ability to communicate needs accurately will improve Outcome: Progressing   Problem: Nutrition: Goal: Risk of aspiration will decrease Outcome: Progressing Goal: Dietary intake will improve Outcome: Progressing   "

## 2024-05-29 NOTE — Progress Notes (Signed)
 " PROGRESS NOTE    [name]  FMW:[id number] DOB: Oct 20, 1930 DOA: 05/26/2024 PCP: Pcp, No    Brief Narrative:  [name] is a 89 year old female with hypertension, hypothyroidism, GERD, chronic pain, and recurrent falls who presents with acute altered mental status and new left-sided weakness.  She was last known normal around midnight. Her sister found her this morning confused with new left arm and leg weakness, prompting EMS transport. At baseline, she is cognitively intact and ambulatory with a walker, without known focal neurologic deficits. Found to have a brain mass, seen by NS, poor surgical candidate.  Family plans to meet with palliative care    Assessment and Plan: Acute encephalopathy, left sided weakness: No severe metabolic derangements, most likely related to temporal mass with midline shift also explaining neurological deficit, also possibly post-concussive, and also possibly from polypharmacy (opioids, pinpoint pupils, and later benzo x2 for MRI). CK wnl.  - Neuro checks q4h - Limit sedating meds -Ammonia slightly elevated so we will make sure patient has a bowel regimen   Right temporal mass:  - Given decadron  given her mental status changes/neurological deficits, will continue (though this would reduce biopsy yield) for now. Decadron  4mg  BID.  -MRI:  Enhancing mass in the lateral right temporal lobe measuring 1.5 x 1.2 x 1.3 cm, with additional smaller enhancing nodules suggestive of satellite lesions.  Surrounding vasogenic edema and approximately 6 mm leftward midline shift. Findings suspicious for metastatic disease.  - NS consulted: Option of brain surgery for establishing diagnosis was discussed.  However, her performance status is poor and she appears to be a poor candidate for any chemotherapy or radiation.  The patient and sister do not want any chemotherapy or radiation treatments.  As such, she is an appropriate candidate for palliative care.    Suspected  Acute CVA but actually likely CLOC -seen on MRI -added stroke orderset -will discuss with neurology-- likely cloc--- (cytotoxic lesion)--- can add asa 81mg  if goals change  Lymphocytic leukocytosis: Not typical of bacterial infection, unclear acuity (previous labs from 2017 were normal).  -WBC normal on 12/28   Hypoxia: Given history, suspect hypoventilation primarily due to AMS. No infiltrate or other causative etiology on CT chest.    Hypothyroidism: -reorder synthroid  (listed as ). TSH was wnl.    Anxiety:  - PDMP notes regular prescriptions from [facility] (in [place], [place]) of xanax  0.5mg  #60/30-days. Given risk of seizure with withdrawal, will reorder this prn and of course would hold for sedation.    Delirium precautions  Patient is confused, so have updated sister.  Patient is from [place]  and lives alone-has a nurse that comes every 2 weeks and a housekeeper that comes occasionally.  Patient drove up from   to [facility] to spend Christmas with her sister (her only living relative).  Agreeable to palliative care consult   DVT prophylaxis: heparin  injection 5,000 Units Start: 05/27/24 0600    Code Status: Full Code Family Communication: sister  Disposition Plan:  Level of care: Med-Surg     Consultants:  NS   Subjective: C/o nausea  Objective: Vitals:   05/28/24 2152 05/29/24 0030 05/29/24 0456 05/29/24 0811  BP: (!) 137/57 123/63 (!) 141/59 (!) 137/54  Pulse: 66 67 67 (!) 58  Resp:  16 16   Temp:  98.1 F (36.7 C) 98.1 F (36.7 C)   TempSrc:      SpO2:  95% 92% 100%    Intake/Output Summary (Last 24 hours) at 05/29/2024  1053 Last data filed at 05/28/2024 1741 Gross per 24 hour  Intake 118 ml  Output 200 ml  Net -82 ml   There were no vitals filed for this visit.  Examination:   General: Appearance:    Overweight female in no acute distress     Lungs:      respirations unlabored  Heart:    Bradycardic.    MS:   All  extremities are intact.    Neurologic: Awake and conversant-- repeats sentences       Data Reviewed: I have personally reviewed following labs and imaging studies  CBC: Recent Labs  Lab 05/23/24 1311 05/26/24 1254 05/26/24 1302 05/27/24 0314  WBC 12.4* 14.1*  --  8.0  NEUTROABS 2.6 4.7  --  3.6  HGB 14.3 12.8 12.2  12.2 12.1  HCT 42.6 37.8 36.0  36.0 35.9*  MCV 92.2 91.3  --  90.4  PLT 190 196  --  158   Basic Metabolic Panel: Recent Labs  Lab 05/23/24 1311 05/26/24 1254 05/26/24 1302 05/27/24 0314  NA 138 140 140  141 138  K 3.9 3.9 3.8  3.8 3.9  CL 101 102 101 102  CO2 27 29  --  26  GLUCOSE 92 105* 103* 163*  BUN 14 11 11 14   CREATININE 1.10* 1.15* 1.20* 0.92  CALCIUM  10.0 9.5  --  9.2   GFR: CrCl cannot be calculated (Unknown ideal weight.). Liver Function Tests: Recent Labs  Lab 05/23/24 1311 05/26/24 1254  AST 22 49*  ALT 16 33  ALKPHOS 85 81  BILITOT 0.5 0.5  PROT 7.4 6.7  ALBUMIN  4.1 3.6   No results for input(s): LIPASE, AMYLASE in the last 168 hours. Recent Labs  Lab 05/26/24 1242  AMMONIA 41*   Coagulation Profile: No results for input(s): INR, PROTIME in the last 168 hours. Cardiac Enzymes: Recent Labs  Lab 05/23/24 1311  CKTOTAL 48   BNP (last 3 results) No results for input(s): PROBNP in the last 8760 hours. HbA1C: Recent Labs    05/29/24 0239  HGBA1C 5.6   CBG: Recent Labs  Lab 05/27/24 0458  GLUCAP 153*   Lipid Profile: Recent Labs    05/29/24 0239  CHOL 179  HDL 35*  LDLCALC 111*  TRIG 165*  CHOLHDL 5.1   Thyroid Function Tests: Recent Labs    05/26/24 1254  TSH 0.929   Anemia Panel: No results for input(s): VITAMINB12, FOLATE, FERRITIN, TIBC, IRON, RETICCTPCT in the last 72 hours. Sepsis Labs: Recent Labs  Lab 05/26/24 1307 05/26/24 1649  LATICACIDVEN 1.4 1.0    Recent Results (from the past 240 hours)  Resp panel by RT-PCR (RSV, Flu A&B, Covid) Anterior Nasal Swab      Status: None   Collection Time: 05/23/24  1:23 PM   Specimen: Anterior Nasal Swab  Result Value Ref Range Status   SARS Coronavirus 2 by RT PCR NEGATIVE NEGATIVE Final   Influenza A by PCR NEGATIVE NEGATIVE Final   Influenza B by PCR NEGATIVE NEGATIVE Final    Comment: (NOTE) The Xpert Xpress SARS-CoV-2/FLU/RSV plus assay is intended as an aid in the diagnosis of influenza from Nasopharyngeal swab specimens and should not be used as a sole basis for treatment. Nasal washings and aspirates are unacceptable for Xpert Xpress SARS-CoV-2/FLU/RSV testing.  Fact Sheet for Patients: bloggercourse.com  Fact Sheet for Healthcare Providers: seriousbroker.it  This test is not yet approved or cleared by the [place]  FDA and has been authorized  for detection and/or diagnosis of SARS-CoV-2 by FDA under an Emergency Use Authorization (EUA). This EUA will remain in effect (meaning this test can be used) for the duration of the COVID-19 declaration under Section 564(b)(1) of the Act, 21 U.S.C. section 360bbb-3(b)(1), unless the authorization is terminated or revoked.     Resp Syncytial Virus by PCR NEGATIVE NEGATIVE Final    Comment: (NOTE) Fact Sheet for Patients: bloggercourse.com  Fact Sheet for Healthcare Providers: seriousbroker.it  This test is not yet approved or cleared by the [place]  FDA and has been authorized for detection and/or diagnosis of SARS-CoV-2 by FDA under an Emergency Use Authorization (EUA). This EUA will remain in effect (meaning this test can be used) for the duration of the COVID-19 declaration under Section 564(b)(1) of the Act, 21 U.S.C. section 360bbb-3(b)(1), unless the authorization is terminated or revoked.  Performed at [facility], [address], [place], [place] [address]   Urine Culture     Status: None   Collection Time: 05/23/24   8:02 PM   Specimen: Urine, Catheterized  Result Value Ref Range Status   Specimen Description URINE, CATHETERIZED  Final   Special Requests NONE  Final   Culture   Final    NO GROWTH Performed at [facility], [address] [place], [place] [address]    Report Status 05/25/2024 FINAL  Final  Resp panel by RT-PCR (RSV, Flu A&B, Covid) Anterior Nasal Swab     Status: None   Collection Time: 05/27/24  3:14 AM   Specimen: Anterior Nasal Swab  Result Value Ref Range Status   SARS Coronavirus 2 by RT PCR NEGATIVE NEGATIVE Final   Influenza A by PCR NEGATIVE NEGATIVE Final   Influenza B by PCR NEGATIVE NEGATIVE Final    Comment: (NOTE) The Xpert Xpress SARS-CoV-2/FLU/RSV plus assay is intended as an aid in the diagnosis of influenza from Nasopharyngeal swab specimens and should not be used as a sole basis for treatment. Nasal washings and aspirates are unacceptable for Xpert Xpress SARS-CoV-2/FLU/RSV testing.  Fact Sheet for Patients: bloggercourse.com  Fact Sheet for Healthcare Providers: seriousbroker.it  This test is not yet approved or cleared by the [place]  FDA and has been authorized for detection and/or diagnosis of SARS-CoV-2 by FDA under an Emergency Use Authorization (EUA). This EUA will remain in effect (meaning this test can be used) for the duration of the COVID-19 declaration under Section 564(b)(1) of the Act, 21 U.S.C. section 360bbb-3(b)(1), unless the authorization is terminated or revoked.     Resp Syncytial Virus by PCR NEGATIVE NEGATIVE Final    Comment: (NOTE) Fact Sheet for Patients: bloggercourse.com  Fact Sheet for Healthcare Providers: seriousbroker.it  This test is not yet approved or cleared by the [place]  FDA and has been authorized for detection and/or diagnosis of SARS-CoV-2 by FDA under an Emergency Use Authorization (EUA).  This EUA will remain in effect (meaning this test can be used) for the duration of the COVID-19 declaration under Section 564(b)(1) of the Act, 21 U.S.C. section 360bbb-3(b)(1), unless the authorization is terminated or revoked.  Performed at [facility], [address], [place], [place] [address]          Radiology Studies: MR Brain W and Wo Contrast Result Date: 05/27/2024 EXAM: MRI BRAIN WITH AND WITHOUT CONTRAST 05/27/2024 12:41:32 PM TECHNIQUE: Multiplanar multisequence MRI of the head/brain was performed with and without the administration of 8 mL of gadobutrol  (GADAVIST ) 1 MMOL/ML injection. Examination  is limited due to motion artifact. COMPARISON: CTA head and neck 05/26/2024. CLINICAL HISTORY: Brain metastases, unknown primary; R temporal mass. FINDINGS: BRAIN AND VENTRICLES: There is a 1.5 x 1.2 x 1.3 cm enhancing mass in the lateral right temporal lobe corresponding to findings on CTA. There are additional smaller enhancing nodules suggestive of satellite lesions; the most pronounced such lesion is located posterior and superior to the dominant lesion and measures up to 1.0 cm (series 26, image 3). There is surrounding vasogenic edema throughout the right temporal lobe with associated mass effect and sulcal effacement. There is partial effacement of the right temporal horn and atrium of the right lateral ventricle. Approximately 6 mm leftward midline shift. There is a punctate focus of restricted diffusion in the right aspect of the splenium of corpus callosum concerning for small acute infarct (series 9, image 84). No significant associated edema. T2 and FLAIR hyperintensity in the periventricular and subcortical white matter suggestive of mild chronic microvascular ischemic changes. Possible small remote infarct in the right cerebellum. There is an additional region of edema within the anterior paramedial left frontal lobe (series 19, image 19). This region is obscured by  motion artifact on the postcontrast images. On the sagittal postcontrast images (series 26), this region is relatively free of artifact and there is no definite enhancement appreciated. Finding could reflect an additional metastatic lesion although no enhancing mass is appreciated on the current study. No acute intracranial hemorrhage. Normal flow voids. The sella is unremarkable. ORBITS: Bilateral lens replacement. SINUSES: No acute abnormality. BONES AND SOFT TISSUES: Normal bone marrow signal and enhancement. No acute soft tissue abnormality. IMPRESSION: 1. Enhancing mass in the lateral right temporal lobe measuring 1.5 x 1.2 x 1.3 cm, with additional smaller enhancing nodules suggestive of satellite lesions. Surrounding vasogenic edema and approximately 6 mm leftward midline shift. Findings suspicious for metastatic disease. 2. Additional region of edema within the anterior paramedial left frontal lobe, obscured by motion artifact on postcontrast images, concerning for additional metastatic lesion. No definite enhancement appreciated. 3. Punctate acute infarct in the right aspect of the splenium of corpus callosum. Electronically signed by: Donnice Mania MD 05/27/2024 01:11 PM EST RP Workstation: [id number]        Scheduled Meds:   stroke: early stages of recovery book   Does not apply Once   dexamethasone  (DECADRON ) injection  4 mg Intravenous Q12H   heparin   5,000 Units Subcutaneous Q8H   levothyroxine   75 mcg Oral QAC breakfast   lidocaine  1 patch Transdermal Q24H   metoprolol  succinate  12.5 mg Oral BID   oxybutynin   5 mg Oral Daily   pantoprazole   40 mg Oral Daily   polyethylene glycol  17 g Oral Daily   sodium chloride  flush  3 mL Intravenous Q12H   Continuous Infusions:     LOS: 2 days    Time spent: 45 minutes spent on chart review, discussion with nursing staff, consultants, updating family and interview/physical exam; more than 50% of that time was spent in counseling and/or  coordination of care.    Harlene RAYMOND Bowl, DO [facility] Available via Epic secure chat 7am-7pm After these hours, please refer to coverage provider listed on [email or web address] 05/29/2024, 10:53 AM   "

## 2024-05-29 NOTE — Progress Notes (Signed)
 Transition of Care [facility]) - Inpatient Brief Assessment   Patient Details  Name: [name] MRN: [id number] Date of Birth: [birth year]  [facility] of [facility]) CM/SW Contact:    Rosaline JONELLE Joe, RN Phone Number: 05/29/2024, 12:07 PM   Clinical Narrative: Palliative Care team met with the patient's sister at the bedside and comfort care and Inpatient Hospice placement at [facility] was requested by the patient's family.  Referral was placed to [facility] and RNCM plans to evaluate the patient at the bedside and speak with the family - pending at this time.   Transition of Care Asessment: Insurance and Status: (P) Insurance coverage has been reviewed Patient has primary care physician: (P) Yes Home environment has been reviewed: (P) from home Prior level of function:: (P) family care   Social Drivers of Health Review: (P) SDOH reviewed needs interventions Readmission risk has been reviewed: (P) Yes Transition of care needs: (P) transition of care needs identified, TOC will continue to follow

## 2024-05-29 NOTE — Progress Notes (Signed)
 [id number] [facility] Liaison Note  Recevied request from Care Manager for family interest in [facility].  Chart reviewed and spoke with patient and family bedside to acknowledge referral.  Hospice eligibility is pending. Unfortunately [facility] is not able to offer a bed today.  Family and Care Manager aware hospital liaison will follow up tomorrow or sooner if room becomes available.  Please do not hesitate to call with questions.  Thank you, Inocente Jacobs RN BSN [facility] Liaison [phone]
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
## 2024-05-30 DIAGNOSIS — Z515 Encounter for palliative care: Secondary | ICD-10-CM | POA: Diagnosis not present

## 2024-05-30 DIAGNOSIS — Z7189 Other specified counseling: Secondary | ICD-10-CM | POA: Diagnosis not present

## 2024-05-30 DIAGNOSIS — G9389 Other specified disorders of brain: Secondary | ICD-10-CM | POA: Diagnosis not present

## 2024-05-30 DIAGNOSIS — R531 Weakness: Secondary | ICD-10-CM | POA: Diagnosis not present

## 2024-05-30 MED ORDER — SENNOSIDES-DOCUSATE SODIUM 8.6-50 MG PO TABS
1.0000 | ORAL_TABLET | Freq: Two times a day (BID) | ORAL | Status: DC
  Administered 2024-06-01 – 2024-06-11 (×14): 1 via ORAL
  Filled 2024-05-30 (×18): qty 1

## 2024-05-30 MED ORDER — BISACODYL 10 MG RE SUPP: 10.0000 mg | Freq: Once | RECTAL | Status: DC

## 2024-05-30 NOTE — Progress Notes (Signed)
 PT Cancellation Note  Patient Details Name: Mara Favero MRN: [id number] DOB: [birth year]   Cancelled Treatment:    Reason Eval/Treat Not Completed: (P) Other (comment) (Pt is now comfort care with plans for hospice.  Spoke with attending to d/c order at this time.)   Treyshawn Muldrew J Jaydon Soroka 05/30/2024, 2:09 PM  Bradie Lacock R. , PTA Acute Rehabilitation Services Office [phone]

## 2024-05-30 NOTE — Plan of Care (Signed)
" °  Problem: Clinical Measurements: Goal: Diagnostic test results will improve Outcome: Progressing Goal: Respiratory complications will improve Outcome: Progressing Goal: Cardiovascular complication will be avoided Outcome: Progressing   Problem: Activity: Goal: Risk for activity intolerance will decrease Outcome: Progressing   Problem: Nutrition: Goal: Adequate nutrition will be maintained Outcome: Progressing   Problem: Coping: Goal: Level of anxiety will decrease Outcome: Progressing   Problem: Elimination: Goal: Will not experience complications related to bowel motility Outcome: Progressing Goal: Will not experience complications related to urinary retention Outcome: Progressing   Problem: Pain Managment: Goal: General experience of comfort will improve and/or be controlled Outcome: Progressing   Problem: Safety: Goal: Ability to remain free from injury will improve Outcome: Progressing   Problem: Skin Integrity: Goal: Risk for impaired skin integrity will decrease Outcome: Progressing   Problem: Education: Goal: Knowledge of disease or condition will improve Outcome: Progressing Goal: Knowledge of secondary prevention will improve (MUST DOCUMENT ALL) Outcome: Progressing Goal: Knowledge of patient specific risk factors will improve (DELETE if not current risk factor) Outcome: Progressing   Problem: Ischemic Stroke/TIA Tissue Perfusion: Goal: Complications of ischemic stroke/TIA will be minimized Outcome: Progressing   Problem: Coping: Goal: Will verbalize positive feelings about self Outcome: Progressing Goal: Will identify appropriate support needs Outcome: Progressing   Problem: Health Behavior/Discharge Planning: Goal: Ability to manage health-related needs will improve Outcome: Progressing Goal: Goals will be collaboratively established with patient/family Outcome: Progressing   Problem: Self-Care: Goal: Ability to participate in self-care as  condition permits will improve Outcome: Progressing Goal: Verbalization of feelings and concerns over difficulty with self-care will improve Outcome: Progressing Goal: Ability to communicate needs accurately will improve Outcome: Progressing   Problem: Nutrition: Goal: Risk of aspiration will decrease Outcome: Progressing Goal: Dietary intake will improve Outcome: Progressing   Problem: Education: Goal: Knowledge of the prescribed therapeutic regimen will improve Outcome: Progressing   Problem: Coping: Goal: Ability to identify and develop effective coping behavior will improve Outcome: Progressing   Problem: Clinical Measurements: Goal: Quality of life will improve Outcome: Progressing   Problem: Respiratory: Goal: Verbalizations of increased ease of respirations will increase Outcome: Progressing   Problem: Role Relationship: Goal: Family's ability to cope with current situation will improve Outcome: Progressing Goal: Ability to verbalize concerns, feelings, and thoughts to partner or family member will improve Outcome: Progressing   Problem: Pain Management: Goal: Satisfaction with pain management regimen will improve Outcome: Progressing   "

## 2024-05-30 NOTE — Progress Notes (Signed)
 "                                                                                                                                                                                                         Palliative Medicine Progress Note   Patient Name: [name]       Date: 05/30/2024 DOB: 03/03/1931  Age: 89 y.o. MRN#: [id number] Attending Physician: Caleen Colander, MD Primary Care Physician: Pcp, No Admit Date: 05/26/2024  Reason for Consultation/Follow-up: {Reason for Consult:23484}  HPI/Patient Profile: 89 y.o. female  with past medical history of hypertension, hypothyroidism, GERD, chronic pain, and recurrent falls who presented to the ED on 05/26/2024 with altered mental status and new left-sided weakness.  She was found to have a right temporal mass concerning for metastatic disease versus primary malignant brain tumor.   Palliative Medicine has been consulted for goals of care discussions and complex medical decision making.   Subjective: Chart reviewed. Update received from RN. Patient is currently being cleaned up and repositioned, seems very uncomfortable.   I spoke with her sister as a follow-up for palliative needs and emotional support.   Objective:  Physical Exam Vitals reviewed.  Constitutional:      General: She is not in acute distress.    Appearance: She is ill-appearing.  Pulmonary:     Effort: Pulmonary effort is normal.  Neurological:     Mental Status: She is confused.     Motor: Weakness present.  Psychiatric:        Cognition and Memory: Memory is impaired.             Vital Signs: BP 133/78 (BP Location: Right Arm)   Pulse 60   Temp 98.7 F (37.1 C) (Axillary)   Resp 17   SpO2 94%  SpO2: SpO2: 94 % O2 Device: O2 Device: Room Air O2 Flow Rate: O2 Flow Rate (L/min): 2.5 L/min  Intake/output summary: No intake or output data in the 24 hours ending 05/30/24 1225  LBM: Last BM Date : 05/29/24     Palliative Assessment/Data:  ***     Palliative Medicine Assessment & Plan   Assessment: Principal Problem:   Left-sided weakness Active Problems:   Anxiety state   Hypothyroidism    Recommendations/Plan: Continue comfort care Referral made to [facility] - no beds available today Continue IV dexamethasone  until discharge Ongoing PMT support   Symptom Management:  Morphine  prn for pain or dyspnea Lorazepam  (ATIVAN ) prn for anxiety Haloperidol (HALDOL) prn for agitation  Glycopyrrolate (ROBINUL) for excessive secretions Ondansetron  (ZOFRAN ) prn for  nausea Polyvinyl alcohol (LIQUIFILM TEARS) prn for dry eyes Antiseptic oral rinse (BIOTENE) prn for dry mouth     Primary Decision Maker: NEXT OF KIN - sister/Carol   Code Status/Advance Care Planning: DNR   Prognosis:  < 2 weeks  Care plan was discussed with ***  Thank you for allowing the Palliative Medicine Team to assist in the care of this patient.   ***   Recardo KATHEE Loll, NP   Please contact Palliative Medicine Team phone at [phone] for questions and concerns.  For individual providers, please see AMION.      "

## 2024-05-30 NOTE — TOC Progression Note (Signed)
 Transition of Care [facility]) - Progression Note    Patient Details  Name: [name] MRN: [id number] Date of Birth: 01-Nov-1930  [facility]) CM/SW Contact  Rosaline JONELLE Joe, RN Phone Number: 05/30/2024, 11:39 AM  Clinical Narrative:    CM spoke with Amy, RNCM with [facility] and [facility] facility currently has no bed offers for admission at this time.                     Expected Discharge Plan and Services                                               Social Drivers of Health (SDOH) Interventions SDOH Screenings   Food Insecurity: Patient Unable To Answer (05/28/2024)  Housing: Unknown (05/28/2024)  Transportation Needs: Patient Unable To Answer (05/28/2024)  Utilities: Patient Unable To Answer (05/28/2024)  Financial Resource Strain: Low Risk (07/12/2023)   Received from [facility]   Social Connections: Patient Unable To Answer (05/28/2024)  Tobacco Use: Low Risk (05/26/2024)    Readmission Risk Interventions     No data to display

## 2024-05-30 NOTE — Progress Notes (Signed)
 " PROGRESS NOTE    [name]  FMW:[id number] DOB: Sep 20, 1930 DOA: 05/26/2024 PCP: Pcp, No  Subjective: Patient reports having abdominal pain, nausea and not being able to keep any fluid down. Hasn't had a BM in a few days    Hospital Course: 89 year old female with hypertension, hypothyroidism, GERD, chronic pain, and recurrent falls who presents with acute altered mental status and new left-sided weakness. Her sister found her this morning confused with new left arm and leg weakness, prompting EMS transport. At baseline, she is cognitively intact and ambulatory with a walker, without known focal neurologic deficits. Found to have a brain mass, seen by NS, poor surgical candidate. She was seen by palliative and transitioned to comfort care and hospice following    Assessment and Plan:  Acute encephalopathy, left sided weakness: No severe metabolic derangements, most likely related to temporal mass with midline shift also explaining neurological deficit, also possibly post-concussive, and also possibly from polypharmacy (opioids, pinpoint pupils, and later benzo x2 for MRI). CK wnl.  - MRI:  Enhancing mass in the lateral right temporal lobe measuring 1.5 x 1.2 x 1.3 cm, with additional smaller enhancing nodules suggestive of satellite lesions.  Surrounding vasogenic edema and approximately 6 mm leftward midline shift. Findings suspicious for metastatic disease. - continue decadron  4 mg BID - neurosurgery was consulted and she was deemed high risk surgical candidate. Met with palliative and transitioned to comfort care - started on bowel regimen, continue PRN antiemetics    Suspected Acute CVA but actually likely CLOC -seen on MRI - held off on aspirin as she is now comfort care   Lymphocytic leukocytosis: Not typical of bacterial infection, unclear acuity (previous labs from 2017 were normal).  -WBC normal on 12/28   Hypothyroidism: - synthroid  stopped now that she is comfort care    Anxiety:  - continue PRN ativan      DVT prophylaxis:  None because of comfort care   Code Status: Do not attempt resuscitation (DNR) - Comfort care Family Communication: Updated at bedside Disposition Plan: Hospice  Reason for continuing need for hospitalization: Pending hospice placement   Objective: Vitals:   05/29/24 0811 05/29/24 2019 05/29/24 2139 05/30/24 0743  BP: (!) 137/54 (!) 150/58 (!) 150/58 133/78  Pulse: (!) 58 68 68 60  Resp:  18  17  Temp:  98.7 F (37.1 C)  98.7 F (37.1 C)  TempSrc:    Axillary  SpO2: 100% 94%  94%   No intake or output data in the 24 hours ending 05/30/24 1523 There were no vitals filed for this visit.  Examination:  Physical Exam Vitals and nursing note reviewed.  Constitutional:      General: She is not in acute distress. Cardiovascular:     Rate and Rhythm: Normal rate.  Pulmonary:     Effort: No respiratory distress.     Data Reviewed: I have personally reviewed following labs and imaging studies  CBC: Recent Labs  Lab 05/26/24 1254 05/26/24 1302 05/27/24 0314  WBC 14.1*  --  8.0  NEUTROABS 4.7  --  3.6  HGB 12.8 12.2  12.2 12.1  HCT 37.8 36.0  36.0 35.9*  MCV 91.3  --  90.4  PLT 196  --  158   Basic Metabolic Panel: Recent Labs  Lab 05/26/24 1254 05/26/24 1302 05/27/24 0314  NA 140 140  141 138  K 3.9 3.8  3.8 3.9  CL 102 101 102  CO2 29  --  26  GLUCOSE  105* 103* 163*  BUN 11 11 14   CREATININE 1.15* 1.20* 0.92  CALCIUM  9.5  --  9.2   GFR: CrCl cannot be calculated (Unknown ideal weight.). Liver Function Tests: Recent Labs  Lab 05/26/24 1254  AST 49*  ALT 33  ALKPHOS 81  BILITOT 0.5  PROT 6.7  ALBUMIN  3.6   No results for input(s): LIPASE, AMYLASE in the last 168 hours. Recent Labs  Lab 05/26/24 1242  AMMONIA 41*   Coagulation Profile: No results for input(s): INR, PROTIME in the last 168 hours. Cardiac Enzymes: No results for input(s): CKTOTAL, CKMB, CKMBINDEX,  TROPONINI in the last 168 hours. ProBNP, BNP (last 5 results) No results for input(s): PROBNP, BNP in the last 8760 hours. HbA1C: Recent Labs    05/29/24 0239  HGBA1C 5.6   CBG: Recent Labs  Lab 05/27/24 0458  GLUCAP 153*   Lipid Profile: Recent Labs    05/29/24 0239  CHOL 179  HDL 35*  LDLCALC 111*  TRIG 165*  CHOLHDL 5.1   Thyroid Function Tests: No results for input(s): TSH, T4TOTAL, FREET4, T3FREE, THYROIDAB in the last 72 hours. Anemia Panel: No results for input(s): VITAMINB12, FOLATE, FERRITIN, TIBC, IRON, RETICCTPCT in the last 72 hours. Sepsis Labs: Recent Labs  Lab 05/26/24 1307 05/26/24 1649  LATICACIDVEN 1.4 1.0    Recent Results (from the past 240 hours)  Resp panel by RT-PCR (RSV, Flu A&B, Covid) Anterior Nasal Swab     Status: None   Collection Time: 05/23/24  1:23 PM   Specimen: Anterior Nasal Swab  Result Value Ref Range Status   SARS Coronavirus 2 by RT PCR NEGATIVE NEGATIVE Final   Influenza A by PCR NEGATIVE NEGATIVE Final   Influenza B by PCR NEGATIVE NEGATIVE Final    Comment: (NOTE) The Xpert Xpress SARS-CoV-2/FLU/RSV plus assay is intended as an aid in the diagnosis of influenza from Nasopharyngeal swab specimens and should not be used as a sole basis for treatment. Nasal washings and aspirates are unacceptable for Xpert Xpress SARS-CoV-2/FLU/RSV testing.  Fact Sheet for Patients: bloggercourse.com  Fact Sheet for Healthcare Providers: seriousbroker.it  This test is not yet approved or cleared by the [place]  FDA and has been authorized for detection and/or diagnosis of SARS-CoV-2 by FDA under an Emergency Use Authorization (EUA). This EUA will remain in effect (meaning this test can be used) for the duration of the COVID-19 declaration under Section 564(b)(1) of the Act, 21 U.S.C. section 360bbb-3(b)(1), unless the authorization is terminated  or revoked.     Resp Syncytial Virus by PCR NEGATIVE NEGATIVE Final    Comment: (NOTE) Fact Sheet for Patients: bloggercourse.com  Fact Sheet for Healthcare Providers: seriousbroker.it  This test is not yet approved or cleared by the [place]  FDA and has been authorized for detection and/or diagnosis of SARS-CoV-2 by FDA under an Emergency Use Authorization (EUA). This EUA will remain in effect (meaning this test can be used) for the duration of the COVID-19 declaration under Section 564(b)(1) of the Act, 21 U.S.C. section 360bbb-3(b)(1), unless the authorization is terminated or revoked.  Performed at [facility], [address] [place], [place] [address]   Urine Culture     Status: None   Collection Time: 05/23/24  8:02 PM   Specimen: Urine, Catheterized  Result Value Ref Range Status   Specimen Description URINE, CATHETERIZED  Final   Special Requests NONE  Final   Culture   Final    NO GROWTH Performed at [facility], [address], [place], [place] [address]    Report Status 05/25/2024 FINAL  Final  Resp panel by RT-PCR (RSV, Flu A&B, Covid) Anterior Nasal Swab     Status: None   Collection Time: 05/27/24  3:14 AM   Specimen: Anterior Nasal Swab  Result Value Ref Range Status   SARS Coronavirus 2 by RT PCR NEGATIVE NEGATIVE Final   Influenza A by PCR NEGATIVE NEGATIVE Final   Influenza B by PCR NEGATIVE NEGATIVE Final    Comment: (NOTE) The Xpert Xpress SARS-CoV-2/FLU/RSV plus assay is intended as an aid in the diagnosis of influenza from Nasopharyngeal swab specimens and should not be used as a sole basis for treatment. Nasal washings and aspirates are unacceptable for Xpert Xpress SARS-CoV-2/FLU/RSV testing.  Fact Sheet for Patients: bloggercourse.com  Fact Sheet for Healthcare Providers: seriousbroker.it  This test is not yet approved or  cleared by the [place]  FDA and has been authorized for detection and/or diagnosis of SARS-CoV-2 by FDA under an Emergency Use Authorization (EUA). This EUA will remain in effect (meaning this test can be used) for the duration of the COVID-19 declaration under Section 564(b)(1) of the Act, 21 U.S.C. section 360bbb-3(b)(1), unless the authorization is terminated or revoked.     Resp Syncytial Virus by PCR NEGATIVE NEGATIVE Final    Comment: (NOTE) Fact Sheet for Patients: bloggercourse.com  Fact Sheet for Healthcare Providers: seriousbroker.it  This test is not yet approved or cleared by the [place]  FDA and has been authorized for detection and/or diagnosis of SARS-CoV-2 by FDA under an Emergency Use Authorization (EUA). This EUA will remain in effect (meaning this test can be used) for the duration of the COVID-19 declaration under Section 564(b)(1) of the Act, 21 U.S.C. section 360bbb-3(b)(1), unless the authorization is terminated or revoked.  Performed at [facility], [address] [place], [place] [address]      Radiology Studies: No results found.  Scheduled Meds:   stroke: early stages of recovery book   Does not apply Once   bisacodyl  10 mg Rectal Once   dexamethasone  (DECADRON ) injection  4 mg Intravenous Q12H   lidocaine  1 patch Transdermal Q24H   metoprolol  succinate  12.5 mg Oral BID   oxybutynin   5 mg Oral Daily   pantoprazole   40 mg Oral Daily   polyethylene glycol  17 g Oral Daily   senna-docusate  1 tablet Oral BID   sodium chloride  flush  3 mL Intravenous Q12H   Continuous Infusions:   LOS: 3 days   Time spent: 38 minutes  Casimer Dare, MD  [facility]  05/30/2024, 3:23 PM   "

## 2024-05-31 DIAGNOSIS — Z515 Encounter for palliative care: Secondary | ICD-10-CM | POA: Diagnosis not present

## 2024-05-31 DIAGNOSIS — R638 Other symptoms and signs concerning food and fluid intake: Secondary | ICD-10-CM

## 2024-05-31 DIAGNOSIS — Z7189 Other specified counseling: Secondary | ICD-10-CM | POA: Diagnosis not present

## 2024-05-31 DIAGNOSIS — J189 Pneumonia, unspecified organism: Secondary | ICD-10-CM

## 2024-05-31 DIAGNOSIS — G9389 Other specified disorders of brain: Secondary | ICD-10-CM

## 2024-05-31 DIAGNOSIS — R531 Weakness: Secondary | ICD-10-CM | POA: Diagnosis not present

## 2024-05-31 MED ORDER — DEXAMETHASONE 4 MG PO TABS
4.0000 mg | ORAL_TABLET | Freq: Two times a day (BID) | ORAL | Status: DC
  Administered 2024-05-31 – 2024-06-11 (×19): 4 mg via ORAL
  Filled 2024-05-31 (×20): qty 1

## 2024-05-31 NOTE — Progress Notes (Signed)
 [facility] 7T67 [facility]  Hospice hospital liaison note   Received request from [facility] for family interest in hospice inpatient unit.    Chart reviewed and at this time patient does not meet criteria for hospice inpatient unit.    She is appropriate for hospice services in the home or LTC facility and we would be happy to reassess for inpatient unit appropriateness at a later time if requested.    Thank you for the opportunity to participate in this patient's care.    Eleanor Nail, LPN Hospice hospital liaison [phone]

## 2024-05-31 NOTE — Plan of Care (Signed)
" °  Problem: Clinical Measurements: Goal: Diagnostic test results will improve Outcome: Progressing Goal: Respiratory complications will improve Outcome: Progressing Goal: Cardiovascular complication will be avoided Outcome: Progressing   Problem: Activity: Goal: Risk for activity intolerance will decrease Outcome: Progressing   Problem: Nutrition: Goal: Adequate nutrition will be maintained Outcome: Progressing   Problem: Coping: Goal: Level of anxiety will decrease Outcome: Progressing   Problem: Elimination: Goal: Will not experience complications related to bowel motility Outcome: Progressing Goal: Will not experience complications related to urinary retention Outcome: Progressing   Problem: Pain Managment: Goal: General experience of comfort will improve and/or be controlled Outcome: Progressing   Problem: Safety: Goal: Ability to remain free from injury will improve Outcome: Progressing   Problem: Skin Integrity: Goal: Risk for impaired skin integrity will decrease Outcome: Progressing   Problem: Education: Goal: Knowledge of disease or condition will improve Outcome: Progressing Goal: Knowledge of secondary prevention will improve (MUST DOCUMENT ALL) Outcome: Progressing Goal: Knowledge of patient specific risk factors will improve (DELETE if not current risk factor) Outcome: Progressing   Problem: Ischemic Stroke/TIA Tissue Perfusion: Goal: Complications of ischemic stroke/TIA will be minimized Outcome: Progressing   Problem: Coping: Goal: Will verbalize positive feelings about self Outcome: Progressing Goal: Will identify appropriate support needs Outcome: Progressing   Problem: Health Behavior/Discharge Planning: Goal: Ability to manage health-related needs will improve Outcome: Progressing Goal: Goals will be collaboratively established with patient/family Outcome: Progressing   Problem: Self-Care: Goal: Ability to participate in self-care as  condition permits will improve Outcome: Progressing Goal: Verbalization of feelings and concerns over difficulty with self-care will improve Outcome: Progressing Goal: Ability to communicate needs accurately will improve Outcome: Progressing   Problem: Nutrition: Goal: Risk of aspiration will decrease Outcome: Progressing Goal: Dietary intake will improve Outcome: Progressing   Problem: Education: Goal: Knowledge of the prescribed therapeutic regimen will improve Outcome: Progressing   Problem: Coping: Goal: Ability to identify and develop effective coping behavior will improve Outcome: Progressing   Problem: Clinical Measurements: Goal: Quality of life will improve Outcome: Progressing   Problem: Respiratory: Goal: Verbalizations of increased ease of respirations will increase Outcome: Progressing   Problem: Role Relationship: Goal: Family's ability to cope with current situation will improve Outcome: Progressing Goal: Ability to verbalize concerns, feelings, and thoughts to partner or family member will improve Outcome: Progressing   Problem: Pain Management: Goal: Satisfaction with pain management regimen will improve Outcome: Progressing   "

## 2024-05-31 NOTE — Progress Notes (Signed)
 "                                                                                                                                                                                                         Palliative Medicine Progress Note   Patient Name: [name]       Date: 05/31/2024 DOB: 16-Jun-1930  Age: 89 y.o. MRN#: [id number] Attending Physician: Caleen Colander, MD Primary Care Physician: Pcp, No Admit Date: 05/26/2024   HPI/Patient Profile: 89 y.o. female  with past medical history of hypertension, hypothyroidism, GERD, chronic pain, and recurrent falls who presented to the ED on 05/26/2024 with altered mental status and new left-sided weakness.  She was found to have a right temporal mass concerning for metastatic disease versus primary malignant brain tumor.   Palliative Medicine has been consulted for goals of care discussions and complex medical decision making.   Subjective: Chart reviewed. Patient assessed. She is oriented x 3, but does not know why she is in the hospital, thinks it's because she had a panic attack.  Note lunch tray is at bedside and untouched. She currently denies pain.  Discussed with TOC and [facility] liaison Melissa. Patient is not currently eligible for inpatient hospice, although they will evaluate her again tomorrow.  I spoke with patient's sister by phone and provided updates as per above. Sister states that home hospice is not an option, as she is not able to care for patient herself and has no additional family support. She also states she is not interested in pursuing other hospice facilities as the drive is too far for her. Discussed that if patient is not eligible for [facility], the next option would be SNF/rehab with outpatient palliative.    Objective:  Physical Exam Vitals reviewed.  Constitutional:      General: She is not in acute distress.    Comments: Frail and chronically ill-appearing  Pulmonary:     Effort: Pulmonary effort is  normal.  Neurological:     Mental Status: She is alert and oriented to person, place, and time.  Psychiatric:        Speech: Speech is tangential.               LBM: Last BM Date : 05/30/24      Palliative Medicine Assessment & Plan   Assessment: Principal Problem:   Left-sided weakness Active Problems:   Anxiety state   Hypothyroidism    Recommendations/Plan: Continue comfort care Referral made to [facility] - no beds available today Continue IV dexamethasone  until discharge  Ongoing PMT support   Symptom Management:  Morphine  prn for pain or dyspnea Lorazepam  (ATIVAN ) prn for anxiety Haloperidol (HALDOL) prn for agitation  Glycopyrrolate (ROBINUL) for excessive secretions Ondansetron  (ZOFRAN ) prn for nausea Polyvinyl alcohol (LIQUIFILM TEARS) prn for dry eyes Antiseptic oral rinse (BIOTENE) prn for dry mouth     Primary Decision Maker: NEXT OF KIN - sister/[name]   Code Status/Advance Care Planning: DNR  Discharge Planning: To Be Determined  Care plan was discussed with Dr. Caleen, [facility], RN, hospice liaison  Thank you for allowing the Palliative Medicine Team to assist in the care of this patient.   Time: 35 minutes   Recardo KATHEE Loll, NP   Please contact Palliative Medicine Team phone at [phone] for questions and concerns.  For individual providers, please see AMION.      "

## 2024-05-31 NOTE — Progress Notes (Signed)
 " PROGRESS NOTE    [name]  FMW:[id number] DOB: 22-May-1931 DOA: 05/26/2024 PCP: Pcp, No  Subjective: Patient states her nausea is better, she doesn't recall if she was able to have a BM. She is looking forward to having breakfast.     Hospital Course: 89 year old female with hypertension, hypothyroidism, GERD, chronic pain, and recurrent falls who presents with acute altered mental status and new left-sided weakness. Her sister found her this morning confused with new left arm and leg weakness, prompting EMS transport. At baseline, she is cognitively intact and ambulatory with a walker, without known focal neurologic deficits. Found to have a brain mass, seen by NS, poor surgical candidate. She was seen by palliative and transitioned to comfort care and reviewed by hospice    Assessment and Plan:  Acute encephalopathy, left sided weakness: No severe metabolic derangements, most likely related to temporal mass with midline shift also explaining neurological deficit, also possibly post-concussive, and also possibly from polypharmacy. CK wnl.  - MRI:  Enhancing mass in the lateral right temporal lobe measuring 1.5 x 1.2 x 1.3 cm, with additional smaller enhancing nodules suggestive of satellite lesions.  Surrounding vasogenic edema and approximately 6 mm leftward midline shift. Findings suspicious for metastatic disease. - continue decadron  4 mg BID for now, will change to PO - neurosurgery was consulted and she was deemed high risk surgical candidate. Met with palliative and transitioned to comfort care. Seen by hospice but not an inpatient hospice candidate - started on bowel regimen, continue PRN antiemetics  - comfort care measures only, PRN ativan , haldol, robinul, morphine     Suspected Acute CVA but actually likely CLOC - seen on MRI - held off on aspirin as she is now comfort care   Hypothyroidism: - synthroid  stopped now that she is comfort care    DVT prophylaxis:  None  because of comfort care   Code Status: Do not attempt resuscitation (DNR) - Comfort care Disposition Plan: Comfort care home Reason for continuing need for hospitalization: pending dispo   Objective: Vitals:   05/30/24 0743 05/30/24 2117 05/30/24 2118 05/31/24 0751  BP: 133/78 (!) 141/43 (!) 141/43 (!) 130/51  Pulse: 60 (!) 50 (!) 50 76  Resp: 17 16  18   Temp: 98.7 F (37.1 C) 97.7 F (36.5 C)  98.7 F (37.1 C)  TempSrc: Axillary   Axillary  SpO2: 94% 93%  91%    Intake/Output Summary (Last 24 hours) at 05/31/2024 1356 Last data filed at 05/30/2024 1534 Gross per 24 hour  Intake --  Output 150 ml  Net -150 ml   There were no vitals filed for this visit.  Examination:  Physical Exam Vitals and nursing note reviewed.  Constitutional:      General: She is not in acute distress. Cardiovascular:     Rate and Rhythm: Normal rate.  Pulmonary:     Effort: No respiratory distress.  Abdominal:     General: There is no distension.  Musculoskeletal:     Right lower leg: No edema.     Left lower leg: No edema.     Data Reviewed: I have personally reviewed following labs and imaging studies  CBC: Recent Labs  Lab 05/26/24 1254 05/26/24 1302 05/27/24 0314  WBC 14.1*  --  8.0  NEUTROABS 4.7  --  3.6  HGB 12.8 12.2  12.2 12.1  HCT 37.8 36.0  36.0 35.9*  MCV 91.3  --  90.4  PLT 196  --  158   Basic  Metabolic Panel: Recent Labs  Lab 05/26/24 1254 05/26/24 1302 05/27/24 0314  NA 140 140  141 138  K 3.9 3.8  3.8 3.9  CL 102 101 102  CO2 29  --  26  GLUCOSE 105* 103* 163*  BUN 11 11 14   CREATININE 1.15* 1.20* 0.92  CALCIUM  9.5  --  9.2   GFR: CrCl cannot be calculated (Unknown ideal weight.). Liver Function Tests: Recent Labs  Lab 05/26/24 1254  AST 49*  ALT 33  ALKPHOS 81  BILITOT 0.5  PROT 6.7  ALBUMIN  3.6   No results for input(s): LIPASE, AMYLASE in the last 168 hours. Recent Labs  Lab 05/26/24 1242  AMMONIA 41*   Coagulation  Profile: No results for input(s): INR, PROTIME in the last 168 hours. Cardiac Enzymes: No results for input(s): CKTOTAL, CKMB, CKMBINDEX, TROPONINI in the last 168 hours. ProBNP, BNP (last 5 results) No results for input(s): PROBNP, BNP in the last 8760 hours. HbA1C: Recent Labs    05/29/24 0239  HGBA1C 5.6   CBG: Recent Labs  Lab 05/27/24 0458  GLUCAP 153*   Lipid Profile: Recent Labs    05/29/24 0239  CHOL 179  HDL 35*  LDLCALC 111*  TRIG 165*  CHOLHDL 5.1   Thyroid Function Tests: No results for input(s): TSH, T4TOTAL, FREET4, T3FREE, THYROIDAB in the last 72 hours. Anemia Panel: No results for input(s): VITAMINB12, FOLATE, FERRITIN, TIBC, IRON, RETICCTPCT in the last 72 hours. Sepsis Labs: Recent Labs  Lab 05/26/24 1307 05/26/24 1649  LATICACIDVEN 1.4 1.0    Recent Results (from the past 240 hours)  Resp panel by RT-PCR (RSV, Flu A&B, Covid) Anterior Nasal Swab     Status: None   Collection Time: 05/23/24  1:23 PM   Specimen: Anterior Nasal Swab  Result Value Ref Range Status   SARS Coronavirus 2 by RT PCR NEGATIVE NEGATIVE Final   Influenza A by PCR NEGATIVE NEGATIVE Final   Influenza B by PCR NEGATIVE NEGATIVE Final    Comment: (NOTE) The Xpert Xpress SARS-CoV-2/FLU/RSV plus assay is intended as an aid in the diagnosis of influenza from Nasopharyngeal swab specimens and should not be used as a sole basis for treatment. Nasal washings and aspirates are unacceptable for Xpert Xpress SARS-CoV-2/FLU/RSV testing.  Fact Sheet for Patients: bloggercourse.com  Fact Sheet for Healthcare Providers: seriousbroker.it  This test is not yet approved or cleared by the [place]  FDA and has been authorized for detection and/or diagnosis of SARS-CoV-2 by FDA under an Emergency Use Authorization (EUA). This EUA will remain in effect (meaning this test can be used) for the  duration of the COVID-19 declaration under Section 564(b)(1) of the Act, 21 U.S.C. section 360bbb-3(b)(1), unless the authorization is terminated or revoked.     Resp Syncytial Virus by PCR NEGATIVE NEGATIVE Final    Comment: (NOTE) Fact Sheet for Patients: bloggercourse.com  Fact Sheet for Healthcare Providers: seriousbroker.it  This test is not yet approved or cleared by the [place]  FDA and has been authorized for detection and/or diagnosis of SARS-CoV-2 by FDA under an Emergency Use Authorization (EUA). This EUA will remain in effect (meaning this test can be used) for the duration of the COVID-19 declaration under Section 564(b)(1) of the Act, 21 U.S.C. section 360bbb-3(b)(1), unless the authorization is terminated or revoked.  Performed at [facility] Lab, [address] [place], [place] [address]   Urine Culture     Status: None   Collection Time: 05/23/24  8:02 PM  Specimen: Urine, Catheterized  Result Value Ref Range Status   Specimen Description URINE, CATHETERIZED  Final   Special Requests NONE  Final   Culture   Final    NO GROWTH Performed at [facility] Lab, [address], [place], [place] [address]    Report Status 05/25/2024 FINAL  Final  Resp panel by RT-PCR (RSV, Flu A&B, Covid) Anterior Nasal Swab     Status: None   Collection Time: 05/27/24  3:14 AM   Specimen: Anterior Nasal Swab  Result Value Ref Range Status   SARS Coronavirus 2 by RT PCR NEGATIVE NEGATIVE Final   Influenza A by PCR NEGATIVE NEGATIVE Final   Influenza B by PCR NEGATIVE NEGATIVE Final    Comment: (NOTE) The Xpert Xpress SARS-CoV-2/FLU/RSV plus assay is intended as an aid in the diagnosis of influenza from Nasopharyngeal swab specimens and should not be used as a sole basis for treatment. Nasal washings and aspirates are unacceptable for Xpert Xpress SARS-CoV-2/FLU/RSV testing.  Fact Sheet for  Patients: bloggercourse.com  Fact Sheet for Healthcare Providers: seriousbroker.it  This test is not yet approved or cleared by the [place]  FDA and has been authorized for detection and/or diagnosis of SARS-CoV-2 by FDA under an Emergency Use Authorization (EUA). This EUA will remain in effect (meaning this test can be used) for the duration of the COVID-19 declaration under Section 564(b)(1) of the Act, 21 U.S.C. section 360bbb-3(b)(1), unless the authorization is terminated or revoked.     Resp Syncytial Virus by PCR NEGATIVE NEGATIVE Final    Comment: (NOTE) Fact Sheet for Patients: bloggercourse.com  Fact Sheet for Healthcare Providers: seriousbroker.it  This test is not yet approved or cleared by the [place]  FDA and has been authorized for detection and/or diagnosis of SARS-CoV-2 by FDA under an Emergency Use Authorization (EUA). This EUA will remain in effect (meaning this test can be used) for the duration of the COVID-19 declaration under Section 564(b)(1) of the Act, 21 U.S.C. section 360bbb-3(b)(1), unless the authorization is terminated or revoked.  Performed at [facility], [address] [place], [place] [address]      Radiology Studies: No results found.  Scheduled Meds:   stroke: early stages of recovery book   Does not apply Once   bisacodyl  10 mg Rectal Once   dexamethasone  (DECADRON ) injection  4 mg Intravenous Q12H   lidocaine  1 patch Transdermal Q24H   metoprolol  succinate  12.5 mg Oral BID   oxybutynin   5 mg Oral Daily   pantoprazole   40 mg Oral Daily   polyethylene glycol  17 g Oral Daily   senna-docusate  1 tablet Oral BID   sodium chloride  flush  3 mL Intravenous Q12H   Continuous Infusions:   LOS: 4 days   Time spent: 38 minutes  Casimer Dare, MD  [facility]  05/31/2024, 1:56 PM   "

## 2024-06-01 MED ORDER — DIPHENHYDRAMINE HCL 50 MG/ML IJ SOLN
25.0000 mg | Freq: Four times a day (QID) | INTRAMUSCULAR | Status: DC | PRN
  Administered 2024-06-01 – 2024-06-02 (×2): 25 mg via INTRAVENOUS
  Filled 2024-06-01 (×3): qty 1

## 2024-06-01 NOTE — Progress Notes (Signed)
 [facility] 7T67 [facility]  Hospice hospital liaison note   Received previous request from [facility] for family interest in hospice inpatient unit.    Chart reviewed by hospice physician and at this time patient does not meet criteria for hospice inpatient unit.    She is appropriate for hospice services in the home or LTC facility and we would be happy to reassess for inpatient unit appropriateness at a later time if requested.    Thank you for the opportunity to participate in this patient's care.    Eleanor Nail, LPN Hospice hospital liaison [phone]

## 2024-06-01 NOTE — Progress Notes (Signed)
 " PROGRESS NOTE    [name]  FMW:[id number] DOB: September 25, 1930 DOA: 05/26/2024 PCP: Pcp, No  Subjective: Patient reports feeling better today, hasn't had any breakfast yet. Denied any nausea or abdominal pain   Hospital Course: 89 year old female with hypertension, hypothyroidism, GERD, chronic pain, and recurrent falls who presents with acute altered mental status and new left-sided weakness. Her sister found her this morning confused with new left arm and leg weakness, prompting EMS transport. At baseline, she is cognitively intact and ambulatory with a walker, without known focal neurologic deficits. Found to have a brain mass, seen by NS, poor surgical candidate. She was seen by palliative and transitioned to comfort care and reviewed by hospice    Assessment and Plan:  Acute encephalopathy, left sided weakness: No severe metabolic derangements, most likely related to temporal mass with midline shift also explaining neurological deficit, also possibly post-concussive, and also possibly from polypharmacy. CK wnl.  - MRI:  Enhancing mass in the lateral right temporal lobe measuring 1.5 x 1.2 x 1.3 cm, with additional smaller enhancing nodules suggestive of satellite lesions.  Surrounding vasogenic edema and approximately 6 mm leftward midline shift. Findings suspicious for metastatic disease. - continue decadron  4 mg BID for now, can slowly wean down  - neurosurgery was consulted and she was deemed high risk surgical candidate. Met with palliative and transitioned to comfort care. Seen by hospice but not an inpatient hospice candidate - continue bowel regimen, continue PRN antiemetics  - comfort care measures, PRN ativan , haldol, robinul, morphine     Suspected Acute CVA but actually likely CLOC - seen on MRI - held off on aspirin as she is now comfort care   Hypothyroidism: - synthroid  stopped now that she is comfort care   DVT prophylaxis:  None because of comfort care   Code  Status: Do not attempt resuscitation (DNR) - Comfort care Disposition Plan: SNF vs LTC Reason for continuing need for hospitalization: Bed offer  Objective: Vitals:   05/31/24 0751 05/31/24 2011 05/31/24 2309 06/01/24 0808  BP: (!) 130/51 (!) 153/51 (!) 142/50 (!) 137/49  Pulse: 76 (!) 53 (!) 51 67  Resp: 18 16 16  (!) 21  Temp: 98.7 F (37.1 C) 98.3 F (36.8 C) 97.8 F (36.6 C) 99.2 F (37.3 C)  TempSrc: Axillary   Oral  SpO2: 91% 94% 93% 90%    Intake/Output Summary (Last 24 hours) at 06/01/2024 1511 Last data filed at 05/31/2024 2300 Gross per 24 hour  Intake 200 ml  Output 350 ml  Net -150 ml   There were no vitals filed for this visit.  Examination:  Physical Exam Vitals and nursing note reviewed.  Constitutional:      General: She is not in acute distress. Cardiovascular:     Rate and Rhythm: Normal rate.  Pulmonary:     Effort: No respiratory distress.  Abdominal:     General: There is no distension.  Musculoskeletal:     Right lower leg: No edema.     Left lower leg: No edema.     Data Reviewed: I have personally reviewed following labs and imaging studies  CBC: Recent Labs  Lab 05/26/24 1254 05/26/24 1302 05/27/24 0314  WBC 14.1*  --  8.0  NEUTROABS 4.7  --  3.6  HGB 12.8 12.2  12.2 12.1  HCT 37.8 36.0  36.0 35.9*  MCV 91.3  --  90.4  PLT 196  --  158   Basic Metabolic Panel: Recent Labs  Lab 05/26/24  1254 05/26/24 1302 05/27/24 0314  NA 140 140  141 138  K 3.9 3.8  3.8 3.9  CL 102 101 102  CO2 29  --  26  GLUCOSE 105* 103* 163*  BUN 11 11 14   CREATININE 1.15* 1.20* 0.92  CALCIUM  9.5  --  9.2   GFR: CrCl cannot be calculated (Unknown ideal weight.). Liver Function Tests: Recent Labs  Lab 05/26/24 1254  AST 49*  ALT 33  ALKPHOS 81  BILITOT 0.5  PROT 6.7  ALBUMIN  3.6   No results for input(s): LIPASE, AMYLASE in the last 168 hours. Recent Labs  Lab 05/26/24 1242  AMMONIA 41*   Coagulation Profile: No results for  input(s): INR, PROTIME in the last 168 hours. Cardiac Enzymes: No results for input(s): CKTOTAL, CKMB, CKMBINDEX, TROPONINI in the last 168 hours. ProBNP, BNP (last 5 results) No results for input(s): PROBNP, BNP in the last 8760 hours. HbA1C: No results for input(s): HGBA1C in the last 72 hours. CBG: Recent Labs  Lab 05/27/24 0458  GLUCAP 153*   Lipid Profile: No results for input(s): CHOL, HDL, LDLCALC, TRIG, CHOLHDL, LDLDIRECT in the last 72 hours. Thyroid Function Tests: No results for input(s): TSH, T4TOTAL, FREET4, T3FREE, THYROIDAB in the last 72 hours. Anemia Panel: No results for input(s): VITAMINB12, FOLATE, FERRITIN, TIBC, IRON, RETICCTPCT in the last 72 hours. Sepsis Labs: Recent Labs  Lab 05/26/24 1307 05/26/24 1649  LATICACIDVEN 1.4 1.0    Recent Results (from the past 240 hours)  Resp panel by RT-PCR (RSV, Flu A&B, Covid) Anterior Nasal Swab     Status: None   Collection Time: 05/23/24  1:23 PM   Specimen: Anterior Nasal Swab  Result Value Ref Range Status   SARS Coronavirus 2 by RT PCR NEGATIVE NEGATIVE Final   Influenza A by PCR NEGATIVE NEGATIVE Final   Influenza B by PCR NEGATIVE NEGATIVE Final    Comment: (NOTE) The Xpert Xpress SARS-CoV-2/FLU/RSV plus assay is intended as an aid in the diagnosis of influenza from Nasopharyngeal swab specimens and should not be used as a sole basis for treatment. Nasal washings and aspirates are unacceptable for Xpert Xpress SARS-CoV-2/FLU/RSV testing.  Fact Sheet for Patients: bloggercourse.com  Fact Sheet for Healthcare Providers: seriousbroker.it  This test is not yet approved or cleared by the [place]  FDA and has been authorized for detection and/or diagnosis of SARS-CoV-2 by FDA under an Emergency Use Authorization (EUA). This EUA will remain in effect (meaning this test can be used) for the duration of  the COVID-19 declaration under Section 564(b)(1) of the Act, 21 U.S.C. section 360bbb-3(b)(1), unless the authorization is terminated or revoked.     Resp Syncytial Virus by PCR NEGATIVE NEGATIVE Final    Comment: (NOTE) Fact Sheet for Patients: bloggercourse.com  Fact Sheet for Healthcare Providers: seriousbroker.it  This test is not yet approved or cleared by the [place]  FDA and has been authorized for detection and/or diagnosis of SARS-CoV-2 by FDA under an Emergency Use Authorization (EUA). This EUA will remain in effect (meaning this test can be used) for the duration of the COVID-19 declaration under Section 564(b)(1) of the Act, 21 U.S.C. section 360bbb-3(b)(1), unless the authorization is terminated or revoked.  Performed at [facility], [address], [place], [place] [address]   Urine Culture     Status: None   Collection Time: 05/23/24  8:02 PM   Specimen: Urine, Catheterized  Result Value Ref Range Status   Specimen Description URINE, CATHETERIZED  Final   Special Requests NONE  Final   Culture   Final    NO GROWTH Performed at [facility], [address], [place], [place] [address]    Report Status 05/25/2024 FINAL  Final  Resp panel by RT-PCR (RSV, Flu A&B, Covid) Anterior Nasal Swab     Status: None   Collection Time: 05/27/24  3:14 AM   Specimen: Anterior Nasal Swab  Result Value Ref Range Status   SARS Coronavirus 2 by RT PCR NEGATIVE NEGATIVE Final   Influenza A by PCR NEGATIVE NEGATIVE Final   Influenza B by PCR NEGATIVE NEGATIVE Final    Comment: (NOTE) The Xpert Xpress SARS-CoV-2/FLU/RSV plus assay is intended as an aid in the diagnosis of influenza from Nasopharyngeal swab specimens and should not be used as a sole basis for treatment. Nasal washings and aspirates are unacceptable for Xpert Xpress SARS-CoV-2/FLU/RSV testing.  Fact Sheet for  Patients: bloggercourse.com  Fact Sheet for Healthcare Providers: seriousbroker.it  This test is not yet approved or cleared by the [place]  FDA and has been authorized for detection and/or diagnosis of SARS-CoV-2 by FDA under an Emergency Use Authorization (EUA). This EUA will remain in effect (meaning this test can be used) for the duration of the COVID-19 declaration under Section 564(b)(1) of the Act, 21 U.S.C. section 360bbb-3(b)(1), unless the authorization is terminated or revoked.     Resp Syncytial Virus by PCR NEGATIVE NEGATIVE Final    Comment: (NOTE) Fact Sheet for Patients: bloggercourse.com  Fact Sheet for Healthcare Providers: seriousbroker.it  This test is not yet approved or cleared by the [place]  FDA and has been authorized for detection and/or diagnosis of SARS-CoV-2 by FDA under an Emergency Use Authorization (EUA). This EUA will remain in effect (meaning this test can be used) for the duration of the COVID-19 declaration under Section 564(b)(1) of the Act, 21 U.S.C. section 360bbb-3(b)(1), unless the authorization is terminated or revoked.  Performed at [facility], [address] [place], [place] [address]      Radiology Studies: No results found.  Scheduled Meds:  dexamethasone   4 mg Oral Q12H   lidocaine  1 patch Transdermal Q24H   metoprolol  succinate  12.5 mg Oral BID   oxybutynin   5 mg Oral Daily   polyethylene glycol  17 g Oral Daily   senna-docusate  1 tablet Oral BID   sodium chloride  flush  3 mL Intravenous Q12H   Continuous Infusions:   LOS: 5 days   Time spent: 38 minutes  Casimer Dare, MD  [facility]  06/01/2024, 3:11 PM   "

## 2024-06-01 NOTE — Plan of Care (Signed)
   Problem: Nutrition: Goal: Adequate nutrition will be maintained Outcome: Progressing
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
## 2024-06-01 NOTE — Plan of Care (Signed)
" °  Problem: Clinical Measurements: Goal: Diagnostic test results will improve Outcome: Progressing Goal: Respiratory complications will improve Outcome: Progressing Goal: Cardiovascular complication will be avoided Outcome: Progressing   Problem: Activity: Goal: Risk for activity intolerance will decrease Outcome: Progressing   Problem: Nutrition: Goal: Adequate nutrition will be maintained Outcome: Progressing   Problem: Coping: Goal: Level of anxiety will decrease Outcome: Progressing   Problem: Elimination: Goal: Will not experience complications related to bowel motility Outcome: Progressing Goal: Will not experience complications related to urinary retention Outcome: Progressing   Problem: Pain Managment: Goal: General experience of comfort will improve and/or be controlled Outcome: Progressing   Problem: Safety: Goal: Ability to remain free from injury will improve Outcome: Progressing   Problem: Skin Integrity: Goal: Risk for impaired skin integrity will decrease Outcome: Progressing   Problem: Education: Goal: Knowledge of disease or condition will improve Outcome: Progressing Goal: Knowledge of secondary prevention will improve (MUST DOCUMENT ALL) Outcome: Progressing Goal: Knowledge of patient specific risk factors will improve (DELETE if not current risk factor) Outcome: Progressing   Problem: Ischemic Stroke/TIA Tissue Perfusion: Goal: Complications of ischemic stroke/TIA will be minimized Outcome: Progressing   Problem: Coping: Goal: Will verbalize positive feelings about self Outcome: Progressing Goal: Will identify appropriate support needs Outcome: Progressing   Problem: Health Behavior/Discharge Planning: Goal: Ability to manage health-related needs will improve Outcome: Progressing Goal: Goals will be collaboratively established with patient/family Outcome: Progressing   Problem: Self-Care: Goal: Ability to participate in self-care as  condition permits will improve Outcome: Progressing Goal: Verbalization of feelings and concerns over difficulty with self-care will improve Outcome: Progressing Goal: Ability to communicate needs accurately will improve Outcome: Progressing   Problem: Nutrition: Goal: Risk of aspiration will decrease Outcome: Progressing Goal: Dietary intake will improve Outcome: Progressing   Problem: Education: Goal: Knowledge of the prescribed therapeutic regimen will improve Outcome: Progressing   Problem: Coping: Goal: Ability to identify and develop effective coping behavior will improve Outcome: Progressing   Problem: Clinical Measurements: Goal: Quality of life will improve Outcome: Progressing   Problem: Respiratory: Goal: Verbalizations of increased ease of respirations will increase Outcome: Progressing   Problem: Role Relationship: Goal: Family's ability to cope with current situation will improve Outcome: Progressing Goal: Ability to verbalize concerns, feelings, and thoughts to partner or family member will improve Outcome: Progressing   Problem: Pain Management: Goal: Satisfaction with pain management regimen will improve Outcome: Progressing   "

## 2024-06-01 NOTE — Care Management Important Message (Signed)
 Important Message  Patient Details  Name: [name] MRN: [id number] Date of Birth: [birth year]   Important Message Given:  Yes - Medicare IM     Claretta Deed 06/01/2024, 3:15 PM

## 2024-06-02 DIAGNOSIS — R531 Weakness: Secondary | ICD-10-CM | POA: Diagnosis not present

## 2024-06-02 MED ORDER — MELATONIN 5 MG PO TABS
5.0000 mg | ORAL_TABLET | Freq: Every evening | ORAL | Status: DC | PRN
  Administered 2024-06-03 – 2024-06-06 (×4): 5 mg via ORAL
  Filled 2024-06-02 (×4): qty 1

## 2024-06-02 NOTE — TOC CM/SW Note (Signed)
 Per [facility], pt is not eligible for [facility] at this time. Met with pt this am to discuss the DC plan. Pt is agreeable with SNF. She asked that I call her sister to discuss SNF. Contacted sister, Niels, at [phone]. She reports that she wants pt to go to [facility]. Explained to sister that pt is not ready for inpt hospice at this time. She verbalized understanding. She is agreeable with SNF. She reports that she prefers a facility near her home in [place]. Notified Josie, SW, of the DC plan to SNF.

## 2024-06-02 NOTE — TOC Progression Note (Signed)
 Per Nat with [facility], pt does not currently meet criteria for IPU. Pt's sister Niels requesting SNF placement. Will need updated PT/OT evals to pursue SNF with palliative under pt's Medicare benefits.   Julien Das, MSW, LCSW [phone] (coverage)

## 2024-06-02 NOTE — Progress Notes (Signed)
 " PROGRESS NOTE    [name]  FMW:[id number] DOB: Oct 04, 1930 DOA: 05/26/2024 PCP: Pcp, No   Brief Narrative:  89 year old female with hypertension, hypothyroidism, GERD, chronic pain, and recurrent falls who presents with acute altered mental status and new left-sided weakness. Her sister found her this morning confused with new left arm and leg weakness, prompting EMS transport. At baseline, she is cognitively intact and ambulatory with a walker, without known focal neurologic deficits. Found to have a brain mass, seen by NS, poor surgical candidate. She was seen by palliative and transitioned to comfort care and reviewed by hospice.  Not a candidate for inpt hospice. Needs Placement to SNF vs home with hospice.   Assessment & Plan:  Principal Problem:   Left-sided weakness Active Problems:   Anxiety state   Hypothyroidism   Acute encephalopathy, left sided weakness: No severe metabolic derangements, most likely related to temporal mass with midline shift also explaining neurological deficit, also possibly post-concussive, and also possibly from polypharmacy. CK wnl.  - MRI:  Enhancing mass in the lateral right temporal lobe measuring 1.5 x 1.2 x 1.3 cm, with additional smaller enhancing nodules suggestive of satellite lesions.  Surrounding vasogenic edema and approximately 6 mm leftward midline shift. Findings suspicious for metastatic disease. - continue decadron  4 mg BID for now, can slowly wean down  - neurosurgery was consulted and she was deemed high risk surgical candidate. Met with palliative and transitioned to comfort care. Seen by hospice but not an inpatient hospice candidate - continue bowel regimen, continue PRN antiemetics  - comfort care measures, PRN ativan , haldol , robinul , morphine   -She is alert,awake and oriented at this time   Suspected Acute CVA but actually likely CLOC - seen on MRI - hold off on aspirin as she is now comfort care   Hypothyroidism: On  comfort care now.   DVT prophylaxis:      Code Status: Do not attempt resuscitation (DNR) - Comfort care Family Communication:  None at the bedside Status is: Inpatient Remains inpatient appropriate because: pending placement    Subjective:  No acute events overnight. She was feeling nausea and received medication for that. She is also complaining of insomnia.  Examination:  General exam: Appears calm and comfortable  Respiratory system: Clear to auscultation. Respiratory effort normal. Cardiovascular system: S1 & S2 heard, RRR. No JVD, murmurs, rubs, gallops or clicks. No pedal edema. Gastrointestinal system: Abdomen is nondistended, soft and nontender. No organomegaly or masses felt. Normal bowel sounds heard. Central nervous system: Alert and oriented.  Extremities: Left sided weakness Skin: No rashes, lesions or ulcers      Diet Orders (From admission, onward)     Start     Ordered   05/27/24 0245  Diet regular Room service appropriate? Yes; Fluid consistency: Thin  Diet effective now       Question Answer Comment  Room service appropriate? Yes   Fluid consistency: Thin      05/27/24 0244            Objective: Vitals:   05/31/24 2309 06/01/24 0808 06/01/24 2055 06/02/24 0812  BP: (!) 142/50 (!) 137/49 (!) 168/55 (!) 175/56  Pulse: (!) 51 67 (!) 48 (!) 53  Resp: 16 (!) 21 16   Temp: 97.8 F (36.6 C) 99.2 F (37.3 C) 97.7 F (36.5 C) 97.9 F (36.6 C)  TempSrc:  Oral Oral   SpO2: 93% 90% 95% 95%   No intake or output data in the 24 hours ending  06/02/24 1007 There were no vitals filed for this visit.  Scheduled Meds:  dexamethasone   4 mg Oral Q12H   lidocaine   1 patch Transdermal Q24H   metoprolol  succinate  12.5 mg Oral BID   oxybutynin   5 mg Oral Daily   polyethylene glycol  17 g Oral Daily   senna-docusate  1 tablet Oral BID   sodium chloride  flush  3 mL Intravenous Q12H   Continuous Infusions:  Nutritional status     There is no height  or weight on file to calculate BMI.  Data Reviewed:   CBC: Recent Labs  Lab 05/26/24 1254 05/26/24 1302 05/27/24 0314  WBC 14.1*  --  8.0  NEUTROABS 4.7  --  3.6  HGB 12.8 12.2  12.2 12.1  HCT 37.8 36.0  36.0 35.9*  MCV 91.3  --  90.4  PLT 196  --  158   Basic Metabolic Panel: Recent Labs  Lab 05/26/24 1254 05/26/24 1302 05/27/24 0314  NA 140 140  141 138  K 3.9 3.8  3.8 3.9  CL 102 101 102  CO2 29  --  26  GLUCOSE 105* 103* 163*  BUN 11 11 14   CREATININE 1.15* 1.20* 0.92  CALCIUM  9.5  --  9.2   GFR: CrCl cannot be calculated (Unknown ideal weight.). Liver Function Tests: Recent Labs  Lab 05/26/24 1254  AST 49*  ALT 33  ALKPHOS 81  BILITOT 0.5  PROT 6.7  ALBUMIN  3.6   No results for input(s): LIPASE, AMYLASE in the last 168 hours. Recent Labs  Lab 05/26/24 1242  AMMONIA 41*   Coagulation Profile: No results for input(s): INR, PROTIME in the last 168 hours. Cardiac Enzymes: No results for input(s): CKTOTAL, CKMB, CKMBINDEX, TROPONINI in the last 168 hours. BNP (last 3 results) No results for input(s): PROBNP in the last 8760 hours. HbA1C: No results for input(s): HGBA1C in the last 72 hours. CBG: Recent Labs  Lab 05/27/24 0458  GLUCAP 153*   Lipid Profile: No results for input(s): CHOL, HDL, LDLCALC, TRIG, CHOLHDL, LDLDIRECT in the last 72 hours. Thyroid Function Tests: No results for input(s): TSH, T4TOTAL, FREET4, T3FREE, THYROIDAB in the last 72 hours. Anemia Panel: No results for input(s): VITAMINB12, FOLATE, FERRITIN, TIBC, IRON, RETICCTPCT in the last 72 hours. Sepsis Labs: Recent Labs  Lab 05/26/24 1307 05/26/24 1649  LATICACIDVEN 1.4 1.0    Recent Results (from the past 240 hours)  Resp panel by RT-PCR (RSV, Flu A&B, Covid) Anterior Nasal Swab     Status: None   Collection Time: 05/23/24  1:23 PM   Specimen: Anterior Nasal Swab  Result Value Ref Range Status   SARS  Coronavirus 2 by RT PCR NEGATIVE NEGATIVE Final   Influenza A by PCR NEGATIVE NEGATIVE Final   Influenza B by PCR NEGATIVE NEGATIVE Final    Comment: (NOTE) The Xpert Xpress SARS-CoV-2/FLU/RSV plus assay is intended as an aid in the diagnosis of influenza from Nasopharyngeal swab specimens and should not be used as a sole basis for treatment. Nasal washings and aspirates are unacceptable for Xpert Xpress SARS-CoV-2/FLU/RSV testing.  Fact Sheet for Patients: bloggercourse.com  Fact Sheet for Healthcare Providers: seriousbroker.it  This test is not yet approved or cleared by the [place]  FDA and has been authorized for detection and/or diagnosis of SARS-CoV-2 by FDA under an Emergency Use Authorization (EUA). This EUA will remain in effect (meaning this test can be used) for the duration of the COVID-19 declaration under Section 564(b)(1) of  the Act, 21 U.S.C. section 360bbb-3(b)(1), unless the authorization is terminated or revoked.     Resp Syncytial Virus by PCR NEGATIVE NEGATIVE Final    Comment: (NOTE) Fact Sheet for Patients: bloggercourse.com  Fact Sheet for Healthcare Providers: seriousbroker.it  This test is not yet approved or cleared by the [place]  FDA and has been authorized for detection and/or diagnosis of SARS-CoV-2 by FDA under an Emergency Use Authorization (EUA). This EUA will remain in effect (meaning this test can be used) for the duration of the COVID-19 declaration under Section 564(b)(1) of the Act, 21 U.S.C. section 360bbb-3(b)(1), unless the authorization is terminated or revoked.  Performed at [facility], [address] [place], [place] [address]   Urine Culture     Status: None   Collection Time: 05/23/24  8:02 PM   Specimen: Urine, Catheterized  Result Value Ref Range Status   Specimen Description URINE, CATHETERIZED  Final    Special Requests NONE  Final   Culture   Final    NO GROWTH Performed at [facility], [address] [place], [place] [address]    Report Status 05/25/2024 FINAL  Final  Resp panel by RT-PCR (RSV, Flu A&B, Covid) Anterior Nasal Swab     Status: None   Collection Time: 05/27/24  3:14 AM   Specimen: Anterior Nasal Swab  Result Value Ref Range Status   SARS Coronavirus 2 by RT PCR NEGATIVE NEGATIVE Final   Influenza A by PCR NEGATIVE NEGATIVE Final   Influenza B by PCR NEGATIVE NEGATIVE Final    Comment: (NOTE) The Xpert Xpress SARS-CoV-2/FLU/RSV plus assay is intended as an aid in the diagnosis of influenza from Nasopharyngeal swab specimens and should not be used as a sole basis for treatment. Nasal washings and aspirates are unacceptable for Xpert Xpress SARS-CoV-2/FLU/RSV testing.  Fact Sheet for Patients: bloggercourse.com  Fact Sheet for Healthcare Providers: seriousbroker.it  This test is not yet approved or cleared by the [place]  FDA and has been authorized for detection and/or diagnosis of SARS-CoV-2 by FDA under an Emergency Use Authorization (EUA). This EUA will remain in effect (meaning this test can be used) for the duration of the COVID-19 declaration under Section 564(b)(1) of the Act, 21 U.S.C. section 360bbb-3(b)(1), unless the authorization is terminated or revoked.     Resp Syncytial Virus by PCR NEGATIVE NEGATIVE Final    Comment: (NOTE) Fact Sheet for Patients: bloggercourse.com  Fact Sheet for Healthcare Providers: seriousbroker.it  This test is not yet approved or cleared by the [place]  FDA and has been authorized for detection and/or diagnosis of SARS-CoV-2 by FDA under an Emergency Use Authorization (EUA). This EUA will remain in effect (meaning this test can be used) for the duration of the COVID-19 declaration under Section  564(b)(1) of the Act, 21 U.S.C. section 360bbb-3(b)(1), unless the authorization is terminated or revoked.  Performed at [facility], [address], [place], [place] [address]          Radiology Studies: No results found.         LOS: 6 days   Time spent= 35 mins    Deliliah Room, MD [facility]  If 7PM-7AM, please contact night-coverage  06/02/2024, 10:07 AM  "

## 2024-06-02 NOTE — Plan of Care (Signed)
 " Problem: Clinical Measurements: Goal: Diagnostic test results will improve 06/02/2024 0542 by Jori Tawni HERO, RN Outcome: Progressing 06/01/2024 2104 by Jori Tawni HERO, RN Outcome: Progressing Goal: Respiratory complications will improve 06/02/2024 0542 by Jori Tawni HERO, RN Outcome: Progressing 06/01/2024 2104 by Jori Tawni HERO, RN Outcome: Progressing Goal: Cardiovascular complication will be avoided 06/02/2024 0542 by Jori Tawni HERO, RN Outcome: Progressing 06/01/2024 2104 by Jori Tawni HERO, RN Outcome: Progressing   Problem: Activity: Goal: Risk for activity intolerance will decrease 06/02/2024 0542 by Jori Tawni HERO, RN Outcome: Progressing 06/01/2024 2104 by Jori Tawni HERO, RN Outcome: Progressing   Problem: Nutrition: Goal: Adequate nutrition will be maintained 06/02/2024 0542 by Jori Tawni HERO, RN Outcome: Progressing 06/01/2024 2104 by Jori Tawni HERO, RN Outcome: Progressing   Problem: Coping: Goal: Level of anxiety will decrease 06/02/2024 0542 by Jori Tawni HERO, RN Outcome: Progressing 06/01/2024 2104 by Jori Tawni HERO, RN Outcome: Progressing   Problem: Elimination: Goal: Will not experience complications related to bowel motility 06/02/2024 0542 by Jori Tawni HERO, RN Outcome: Progressing 06/01/2024 2104 by Jori Tawni HERO, RN Outcome: Progressing Goal: Will not experience complications related to urinary retention 06/02/2024 0542 by Jori Tawni HERO, RN Outcome: Progressing 06/01/2024 2104 by Jori Tawni HERO, RN Outcome: Progressing   Problem: Pain Managment: Goal: General experience of comfort will improve and/or be controlled 06/02/2024 0542 by Jori Tawni HERO, RN Outcome: Progressing 06/01/2024 2104 by Jori Tawni HERO, RN Outcome: Progressing   Problem: Safety: Goal: Ability to remain free from injury will improve 06/02/2024 0542 by Jori Tawni HERO,  RN Outcome: Progressing 06/01/2024 2104 by Jori Tawni HERO, RN Outcome: Progressing   Problem: Skin Integrity: Goal: Risk for impaired skin integrity will decrease 06/02/2024 0542 by Jori Tawni HERO, RN Outcome: Progressing 06/01/2024 2104 by Jori Tawni HERO, RN Outcome: Progressing   Problem: Education: Goal: Knowledge of disease or condition will improve 06/02/2024 0542 by Jori Tawni HERO, RN Outcome: Progressing 06/01/2024 2104 by Jori Tawni HERO, RN Outcome: Progressing Goal: Knowledge of secondary prevention will improve (MUST DOCUMENT ALL) 06/02/2024 0542 by Jori Tawni HERO, RN Outcome: Progressing 06/01/2024 2104 by Jori Tawni HERO, RN Outcome: Progressing Goal: Knowledge of patient specific risk factors will improve (DELETE if not current risk factor) 06/02/2024 0542 by Jori Tawni HERO, RN Outcome: Progressing 06/01/2024 2104 by Jori Tawni HERO, RN Outcome: Progressing   Problem: Ischemic Stroke/TIA Tissue Perfusion: Goal: Complications of ischemic stroke/TIA will be minimized 06/02/2024 0542 by Jori Tawni HERO, RN Outcome: Progressing 06/01/2024 2104 by Jori Tawni HERO, RN Outcome: Progressing   Problem: Coping: Goal: Will verbalize positive feelings about self 06/02/2024 0542 by Jori Tawni HERO, RN Outcome: Progressing 06/01/2024 2104 by Jori Tawni HERO, RN Outcome: Progressing Goal: Will identify appropriate support needs 06/02/2024 0542 by Jori Tawni HERO, RN Outcome: Progressing 06/01/2024 2104 by Jori Tawni HERO, RN Outcome: Progressing   Problem: Health Behavior/Discharge Planning: Goal: Ability to manage health-related needs will improve 06/02/2024 0542 by Jori Tawni HERO, RN Outcome: Progressing 06/01/2024 2104 by Jori Tawni HERO, RN Outcome: Progressing Goal: Goals will be collaboratively established with patient/family 06/02/2024 0542 by Jori Tawni HERO, RN Outcome:  Progressing 06/01/2024 2104 by Jori Tawni HERO, RN Outcome: Progressing   Problem: Self-Care: Goal: Ability to participate in self-care as condition permits will improve 06/02/2024 0542 by Jori Tawni HERO, RN Outcome: Progressing 06/01/2024 2104 by Jori Tawni HERO, RN Outcome: Progressing Goal: Verbalization of feelings and concerns over difficulty with self-care will improve 06/02/2024 0542 by Jori Tawni  M, RN Outcome: Progressing 06/01/2024 2104 by Jori Tawni HERO, RN Outcome: Progressing Goal: Ability to communicate needs accurately will improve 06/02/2024 0542 by Jori Tawni HERO, RN Outcome: Progressing 06/01/2024 2104 by Jori Tawni HERO, RN Outcome: Progressing   Problem: Nutrition: Goal: Risk of aspiration will decrease 06/02/2024 0542 by Jori Tawni HERO, RN Outcome: Progressing 06/01/2024 2104 by Jori Tawni HERO, RN Outcome: Progressing Goal: Dietary intake will improve 06/02/2024 0542 by Jori Tawni HERO, RN Outcome: Progressing 06/01/2024 2104 by Jori Tawni HERO, RN Outcome: Progressing   Problem: Education: Goal: Knowledge of the prescribed therapeutic regimen will improve 06/02/2024 0542 by Jori Tawni HERO, RN Outcome: Progressing 06/01/2024 2104 by Jori Tawni HERO, RN Outcome: Progressing   Problem: Coping: Goal: Ability to identify and develop effective coping behavior will improve 06/02/2024 0542 by Jori Tawni HERO, RN Outcome: Progressing 06/01/2024 2104 by Jori Tawni HERO, RN Outcome: Progressing   Problem: Clinical Measurements: Goal: Quality of life will improve 06/02/2024 0542 by Jori Tawni HERO, RN Outcome: Progressing 06/01/2024 2104 by Jori Tawni HERO, RN Outcome: Progressing   Problem: Respiratory: Goal: Verbalizations of increased ease of respirations will increase 06/02/2024 0542 by Jori Tawni HERO, RN Outcome: Progressing 06/01/2024 2104 by Jori Tawni HERO,  RN Outcome: Progressing   Problem: Role Relationship: Goal: Family's ability to cope with current situation will improve 06/02/2024 0542 by Jori Tawni HERO, RN Outcome: Progressing 06/01/2024 2104 by Jori Tawni HERO, RN Outcome: Progressing Goal: Ability to verbalize concerns, feelings, and thoughts to partner or family member will improve 06/02/2024 0542 by Jori Tawni HERO, RN Outcome: Progressing 06/01/2024 2104 by Jori Tawni HERO, RN Outcome: Progressing   Problem: Pain Management: Goal: Satisfaction with pain management regimen will improve 06/02/2024 0542 by Jori Tawni HERO, RN Outcome: Progressing 06/01/2024 2104 by Jori Tawni HERO, RN Outcome: Progressing   "

## 2024-06-03 DIAGNOSIS — R531 Weakness: Secondary | ICD-10-CM | POA: Diagnosis not present

## 2024-06-03 NOTE — Evaluation (Signed)
 Physical Therapy Evaluation Patient Details Name: [name] MRN: [id number] DOB: [birth year] Today's Date: 06/03/2024  History of Present Illness  89 y.o. F who presented to [facility] 05/26/24 d/t to AMS and L side weakness. MRI showed right temporal mass c/f metastatic disease vs. primary malignant brain tumor. Pt and Sister do not want any chemotherapy or radiation treatments. Palliative care following. PMHx: HTN, GERD, hypothyroidism, chronic pain, falls.   Clinical Impression  Pt admitted with above diagnosis. PTA, pt was modI for funcitonal mobility using RW, independent with ADLs/IADLs, and driving. Pt currently with functional limitations due to the deficits listed below (see PT Problem List). She required min-modA for bed mobility and modA x2 for sit<>stand using RW. Pt is currently limited by impaired cognition, anxiety, fear of falling, impaired balance including Lt lateral lean in sitting and posterior lean in standing, and decreased activity tolerance. Pt will benefit from acute skilled PT to increase her independence and safety with mobility to allow discharge. Recommend continued inpatient follow up therapy, <3 hours/day.    If plan is discharge home, recommend the following: Assist for transportation;Help with stairs or ramp for entrance;Supervision due to cognitive status;A lot of help with walking and/or transfers;A lot of help with bathing/dressing/bathroom;Assistance with cooking/housework   Can travel by private vehicle   No    Equipment Recommendations Wheelchair (measurements PT);Hoyer lift;Hospital bed;Wheelchair cushion (measurements PT);BSC/3in1  Recommendations for Other Services       Functional Status Assessment Patient has had a recent decline in their functional status and/or demonstrates limited ability to make significant improvements in function in a reasonable and predictable amount of time     Precautions / Restrictions Precautions Precautions: Fall Recall of  Precautions/Restrictions: Impaired Restrictions Weight Bearing Restrictions Per Provider Order: No      Mobility  Bed Mobility Overal bed mobility: Needs Assistance Bed Mobility: Rolling, Sidelying to Sit, Sit to Sidelying Rolling: Min assist, Used rails Sidelying to sit: Mod assist, HOB elevated, Used rails     Sit to sidelying: Min assist General bed mobility comments: Pt sat up on L side of bed with increased time. Cues for sequencing. Assisted pt in reaching to bed rail. She brought BLE off EOB. Assist to elevate trunk. Scooted pt's hips fwd til feet flat with use of bed pad. Returning to bed pt guided trunk down assist to bring BLE back in.    Transfers Overall transfer level: Needs assistance Equipment used: Rolling walker (2 wheels) Transfers: Sit to/from Stand Sit to Stand: Mod assist, +2 physical assistance           General transfer comment: Pt stood from lowest bed height. Cued proper hand placement. PT/RN on either side of bed. Powered up with modA x2 and aid of bed pad to facilitate hip ext. Pt quickly fatigue and c/o worsening nausea. Returned pt to EOB.    Ambulation/Gait               General Gait Details: Unable at this time.  Stairs            Wheelchair Mobility     Tilt Bed    Modified Rankin (Stroke Patients Only)       Balance Overall balance assessment: Needs assistance Sitting-balance support: Bilateral upper extremity supported, Feet supported Sitting balance-Leahy Scale: Poor Sitting balance - Comments: Pt required CGA-minA to maintain static seated balance d/t left lateral lean. Pt unable to self-correct and fatigues quickly when not supported. Postural control: Left lateral lean, Posterior lean Standing  balance support: Bilateral upper extremity supported, During functional activity, Reliant on assistive device for balance Standing balance-Leahy Scale: Poor Standing balance comment: Pt dependent on RW and modA x2. She  maintained static stance for <30sec and demonstrated a posterior lean and lacked hip ext.                             Pertinent Vitals/Pain Pain Assessment Pain Assessment: No/denies pain    Home Living Family/patient expects to be discharged to:: Skilled nursing facility                   Additional Comments: Per Chart Review, pt was living alone in [facility]. Pt is a questionable historian.    Prior Function Prior Level of Function : Patient poor historian/Family not available;Independent/Modified Independent;Driving             Mobility Comments: Pt reports ambulating using RW. She states there have been many falls in the past 62mo. ADLs Comments: Pt reports she was independent with basic self care. She drove from [facility] to [place] to visit her sister for the holidays.     Extremity/Trunk Assessment   Upper Extremity Assessment Upper Extremity Assessment: Defer to OT evaluation    Lower Extremity Assessment Lower Extremity Assessment: RLE deficits/detail;LLE deficits/detail RLE Deficits / Details: AROM WFL. Generalized weakness, at least 3/5. RLE Sensation: WNL RLE Coordination: WNL LLE Deficits / Details: AROM WFL. Strength grossly 3-/5. LLE Sensation: WNL LLE Coordination: decreased gross motor    Cervical / Trunk Assessment Cervical / Trunk Assessment: Kyphotic  Communication   Communication Communication: No apparent difficulties    Cognition Arousal: Alert Behavior During Therapy: WFL for tasks assessed/performed, Anxious   PT - Cognitive impairments: No family/caregiver present to determine baseline, Orientation, Awareness, Memory, Sequencing, Problem solving, Safety/Judgement   Orientation impairments: Situation (Unaware of why she is in the hospital.)                   PT - Cognition Comments: Pt A,Ox3. She is very fearful of falling and nervous to mobilize. Pt pleasant and agreeable, participated well throughout session. She was  intermittently confused. Decreased insight into current condition. Following commands: Impaired Following commands impaired: Only follows one step commands consistently, Follows one step commands with increased time     Cueing Cueing Techniques: Verbal cues, Gestural cues, Tactile cues, Visual cues     General Comments General comments (skin integrity, edema, etc.): RN provided pt with nausea and pain medication at start of session. Pt continues to c/o nausea, but per RN has not had any emesis - just spits.    Exercises     Assessment/Plan    PT Assessment Patient needs continued PT services  PT Problem List Decreased strength;Decreased activity tolerance;Decreased balance;Decreased mobility;Decreased safety awareness       PT Treatment Interventions DME instruction;Therapeutic exercise;Gait training;Balance training;Neuromuscular re-education;Functional mobility training;Therapeutic activities;Patient/family education;Wheelchair mobility training    PT Goals (Current goals can be found in the Care Plan section)  Acute Rehab PT Goals PT Goal Formulation: Patient unable to participate in goal setting Time For Goal Achievement: 06/17/24 Potential to Achieve Goals: Fair    Frequency Min 2X/week     Co-evaluation               AM-PAC PT 6 Clicks Mobility  Outcome Measure Help needed turning from your back to your side while in a flat bed without using bedrails?: A Little  Help needed moving from lying on your back to sitting on the side of a flat bed without using bedrails?: A Lot Help needed moving to and from a bed to a chair (including a wheelchair)?: Total Help needed standing up from a chair using your arms (e.g., wheelchair or bedside chair)?: Total Help needed to walk in hospital room?: Total Help needed climbing 3-5 steps with a railing? : Total 6 Click Score: 9    End of Session Equipment Utilized During Treatment: Gait belt Activity Tolerance: Patient  tolerated treatment well Patient left: in bed;with call bell/phone within reach;with bed alarm set;Other (comment) (placed in chair position) Nurse Communication: Mobility status;Need for lift equipment PT Visit Diagnosis: Unsteadiness on feet (R26.81);Other abnormalities of gait and mobility (R26.89);Muscle weakness (generalized) (M62.81);Difficulty in walking, not elsewhere classified (R26.2)    Time: 8661-8645 PT Time Calculation (min) (ACUTE ONLY): 16 min   Charges:   PT Evaluation $PT Eval Moderate Complexity: 1 Mod   PT General Charges $$ ACUTE PT VISIT: 1 Visit         Randall SAUNDERS, PT, DPT Acute Rehabilitation Services Office: [phone] Secure Chat Preferred  Delon CHRISTELLA Callander 06/03/2024, 2:59 PM

## 2024-06-03 NOTE — Plan of Care (Signed)
" °  Problem: Safety: Goal: Ability to remain free from injury will improve 06/03/2024 0459 by Cresenciano Asberry PARAS, RN Outcome: Progressing 06/03/2024 0444 by Cresenciano Asberry PARAS, RN Outcome: Progressing   Problem: Skin Integrity: Goal: Risk for impaired skin integrity will decrease 06/03/2024 0459 by Cresenciano Asberry PARAS, RN Outcome: Progressing 06/03/2024 0444 by Cresenciano Asberry PARAS, RN Outcome: Progressing   Problem: Ischemic Stroke/TIA Tissue Perfusion: Goal: Complications of ischemic stroke/TIA will be minimized 06/03/2024 0459 by Cresenciano Asberry PARAS, RN Outcome: Progressing 06/03/2024 0444 by Cresenciano Asberry PARAS, RN Outcome: Progressing   Problem: Pain Management: Goal: Satisfaction with pain management regimen will improve 06/03/2024 0459 by Cresenciano Asberry PARAS, RN Outcome: Progressing 06/03/2024 0444 by Cresenciano Asberry PARAS, RN Outcome: Progressing   "

## 2024-06-03 NOTE — Progress Notes (Signed)
 " PROGRESS NOTE    [name]  FMW:[id number] DOB: Sep 20, 1930 DOA: 05/26/2024 PCP: Pcp, No   Brief Narrative:  89 year old female with hypertension, hypothyroidism, GERD, chronic pain, and recurrent falls who presents with acute altered mental status and new left-sided weakness. Her sister found her this morning confused with new left arm and leg weakness, prompting EMS transport. At baseline, she is cognitively intact and ambulatory with a walker, without known focal neurologic deficits. Found to have a brain mass, seen by NS, poor surgical candidate. She was seen by palliative and transitioned to comfort care and reviewed by hospice.  Not a candidate for inpt hospice. Needs Placement to SNF vs home with hospice.   Assessment & Plan:  Principal Problem:   Left-sided weakness Active Problems:   Anxiety state   Hypothyroidism   Acute encephalopathy, left sided weakness: No severe metabolic derangements, most likely related to temporal mass with midline shift also explaining neurological deficit, also possibly post-concussive, and also possibly from polypharmacy. CK wnl.  - MRI:  Enhancing mass in the lateral right temporal lobe measuring 1.5 x 1.2 x 1.3 cm, with additional smaller enhancing nodules suggestive of satellite lesions.  Surrounding vasogenic edema and approximately 6 mm leftward midline shift. Findings suspicious for metastatic disease. - continue decadron  4 mg BID for now, can slowly wean down  - neurosurgery was consulted and she was deemed high risk surgical candidate. Met with palliative and transitioned to comfort care. Seen by hospice but not an inpatient hospice candidate - continue bowel regimen, continue PRN antiemetics  - comfort care measures, PRN ativan , haldol , robinul , morphine   -She is alert,awake and oriented at this time   Suspected Acute CVA but actually likely CLOC - seen on MRI - hold off on aspirin as she is now comfort care   Hypothyroidism: On  comfort care now.  Bradycardia: Dced metoprolol  on 1/4.   DVT prophylaxis:      Code Status: Do not attempt resuscitation (DNR) - Comfort care Family Communication:  None at the bedside Status is: Inpatient Remains inpatient appropriate because: pending placement    Subjective:  No acute events overnight. Still slightly nauseous. No other active complaints.  Examination:  General exam: Appears calm and comfortable  Respiratory system: Clear to auscultation. Respiratory effort normal. Cardiovascular system: S1 & S2 heard, RRR. No JVD, murmurs, rubs, gallops or clicks. No pedal edema. Gastrointestinal system: Abdomen is nondistended, soft and nontender. No organomegaly or masses felt. Normal bowel sounds heard. Central nervous system: Alert and oriented.  Extremities: Left sided weakness Skin: No rashes, lesions or ulcers      Diet Orders (From admission, onward)     Start     Ordered   05/27/24 0245  Diet regular Room service appropriate? Yes; Fluid consistency: Thin  Diet effective now       Question Answer Comment  Room service appropriate? Yes   Fluid consistency: Thin      05/27/24 0244            Objective: Vitals:   06/02/24 0812 06/02/24 1545 06/02/24 2025 06/03/24 0758  BP: (!) 175/56 (!) 172/58 (!) 163/53 (!) 156/58  Pulse: (!) 53 (!) 44 (!) 56 (!) 49  Resp:   16   Temp: 97.9 F (36.6 C) 98 F (36.7 C) (!) 97.4 F (36.3 C) 98.2 F (36.8 C)  TempSrc:    Oral  SpO2: 95% 95% 95% 96%   No intake or output data in the 24 hours ending 06/03/24  9045 There were no vitals filed for this visit.  Scheduled Meds:  dexamethasone   4 mg Oral Q12H   lidocaine   1 patch Transdermal Q24H   metoprolol  succinate  12.5 mg Oral BID   oxybutynin   5 mg Oral Daily   polyethylene glycol  17 g Oral Daily   senna-docusate  1 tablet Oral BID   sodium chloride  flush  3 mL Intravenous Q12H   Continuous Infusions:  Nutritional status     There is no height or  weight on file to calculate BMI.  Data Reviewed:   CBC: No results for input(s): WBC, NEUTROABS, HGB, HCT, MCV, PLT in the last 168 hours.  Basic Metabolic Panel: No results for input(s): NA, K, CL, CO2, GLUCOSE, BUN, CREATININE, CALCIUM , MG, PHOS in the last 168 hours.  GFR: CrCl cannot be calculated (Unknown ideal weight.). Liver Function Tests: No results for input(s): AST, ALT, ALKPHOS, BILITOT, PROT, ALBUMIN  in the last 168 hours.  No results for input(s): LIPASE, AMYLASE in the last 168 hours. No results for input(s): AMMONIA in the last 168 hours.  Coagulation Profile: No results for input(s): INR, PROTIME in the last 168 hours. Cardiac Enzymes: No results for input(s): CKTOTAL, CKMB, CKMBINDEX, TROPONINI in the last 168 hours. BNP (last 3 results) No results for input(s): PROBNP in the last 8760 hours. HbA1C: No results for input(s): HGBA1C in the last 72 hours. CBG: No results for input(s): GLUCAP in the last 168 hours.  Lipid Profile: No results for input(s): CHOL, HDL, LDLCALC, TRIG, CHOLHDL, LDLDIRECT in the last 72 hours. Thyroid Function Tests: No results for input(s): TSH, T4TOTAL, FREET4, T3FREE, THYROIDAB in the last 72 hours. Anemia Panel: No results for input(s): VITAMINB12, FOLATE, FERRITIN, TIBC, IRON, RETICCTPCT in the last 72 hours. Sepsis Labs: No results for input(s): PROCALCITON, LATICACIDVEN in the last 168 hours.   Recent Results (from the past 240 hours)  Resp panel by RT-PCR (RSV, Flu A&B, Covid) Anterior Nasal Swab     Status: None   Collection Time: 05/27/24  3:14 AM   Specimen: Anterior Nasal Swab  Result Value Ref Range Status   SARS Coronavirus 2 by RT PCR NEGATIVE NEGATIVE Final   Influenza A by PCR NEGATIVE NEGATIVE Final   Influenza B by PCR NEGATIVE NEGATIVE Final    Comment: (NOTE) The Xpert Xpress SARS-CoV-2/FLU/RSV plus  assay is intended as an aid in the diagnosis of influenza from Nasopharyngeal swab specimens and should not be used as a sole basis for treatment. Nasal washings and aspirates are unacceptable for Xpert Xpress SARS-CoV-2/FLU/RSV testing.  Fact Sheet for Patients: bloggercourse.com  Fact Sheet for Healthcare Providers: seriousbroker.it  This test is not yet approved or cleared by the [place]  FDA and has been authorized for detection and/or diagnosis of SARS-CoV-2 by FDA under an Emergency Use Authorization (EUA). This EUA will remain in effect (meaning this test can be used) for the duration of the COVID-19 declaration under Section 564(b)(1) of the Act, 21 U.S.C. section 360bbb-3(b)(1), unless the authorization is terminated or revoked.     Resp Syncytial Virus by PCR NEGATIVE NEGATIVE Final    Comment: (NOTE) Fact Sheet for Patients: bloggercourse.com  Fact Sheet for Healthcare Providers: seriousbroker.it  This test is not yet approved or cleared by the [place]  FDA and has been authorized for detection and/or diagnosis of SARS-CoV-2 by FDA under an Emergency Use Authorization (EUA). This EUA will remain in effect (meaning this test can be used) for the duration of the  COVID-19 declaration under Section 564(b)(1) of the Act, 21 U.S.C. section 360bbb-3(b)(1), unless the authorization is terminated or revoked.  Performed at [facility], [address] [place], [place] [address]          Radiology Studies: No results found.         LOS: 7 days   Time spent= 35 mins    Deliliah Room, MD [facility]  If 7PM-7AM, please contact night-coverage  06/03/2024, 9:54 AM  "

## 2024-06-03 NOTE — Plan of Care (Signed)
  Problem: Pain Managment: Goal: General experience of comfort will improve and/or be controlled Outcome: Progressing   Problem: Safety: Goal: Ability to remain free from injury will improve Outcome: Progressing   Problem: Skin Integrity: Goal: Risk for impaired skin integrity will decrease Outcome: Progressing   Problem: Ischemic Stroke/TIA Tissue Perfusion: Goal: Complications of ischemic stroke/TIA will be minimized Outcome: Progressing

## 2024-06-04 DIAGNOSIS — Z515 Encounter for palliative care: Secondary | ICD-10-CM | POA: Diagnosis not present

## 2024-06-04 DIAGNOSIS — Z7189 Other specified counseling: Secondary | ICD-10-CM | POA: Diagnosis not present

## 2024-06-04 DIAGNOSIS — J189 Pneumonia, unspecified organism: Secondary | ICD-10-CM | POA: Diagnosis not present

## 2024-06-04 DIAGNOSIS — R531 Weakness: Secondary | ICD-10-CM | POA: Diagnosis not present

## 2024-06-04 DIAGNOSIS — G9389 Other specified disorders of brain: Secondary | ICD-10-CM | POA: Diagnosis not present

## 2024-06-04 NOTE — Progress Notes (Signed)
" °                                                                                                                                                     °                                                   °  Palliative Medicine Progress Note   Patient Name: [name]       Date: 06/04/2024 DOB: 22-Jul-1930  Age: 89 y.o. MRN#: [id number] Attending Physician: Dino Antu, MD Primary Care Physician: Pcp, No Admit Date: 05/26/2024    HPI/Patient Profile: 89 y.o. female  with past medical history of hypertension, hypothyroidism, GERD, chronic pain, and recurrent falls who presented to the ED on 05/26/2024 with altered mental status and new left-sided weakness.  She was found to have a right temporal mass concerning for metastatic disease versus primary malignant brain tumor.   Palliative Medicine has been consulted for goals of care discussions and complex medical decision making.  Transitioned to comfort care on 12/30, but had some relative improvement and was deemed not eligible for inpatient hospice.   Subjective: Chart reviewed. Patient assessed. She is alert, oriented, and pleasant. She denies pain. She reports nausea, but no vomiting.   I spoke with her sister Niels at bedside. Reviewed plan for SNF/rehab with outpatient palliative. Discussed that patient can have hospice support if she transitions to LTC. Reviewed that hospice would provide extra care for patient as well as symptom management when she further declines.    Objective:  Physical Exam Vitals reviewed.  Constitutional:      General: She is not in acute distress.    Comments: Frail and chronically ill-appearing  Pulmonary:     Effort: Pulmonary effort is normal.  Neurological:     Mental Status: She is alert and oriented to person, place, and time.             Palliative Medicine Assessment & Plan   Assessment: Principal Problem:   Left-sided weakness Active Problems:   Anxiety state   Hypothyroidism     Recommendations/Plan: Continue supportive care Plan for SNF/rehab Outpatient palliative referral PMT will continue to follow  Code Status: DNR/DNI   Prognosis:  < 6 months  Discharge Planning: Skilled Nursing Facility for rehab with Palliative care service follow-up   Thank you for allowing the Palliative Medicine Team to assist in the care of this patient.   Time: 27 minutes   Recardo KATHEE Loll, NP   Please contact Palliative Medicine Team phone at [phone] for questions and concerns.  For individual providers, please see AMION.      "

## 2024-06-04 NOTE — Progress Notes (Signed)
 " PROGRESS NOTE    [name]  FMW:[id number] DOB: 15-Jun-1930 DOA: 05/26/2024 PCP: Pcp, No   Brief Narrative:  89 year old female with hypertension, hypothyroidism, GERD, chronic pain, and recurrent falls who presents with acute altered mental status and new left-sided weakness. Her sister found her this morning confused with new left arm and leg weakness, prompting EMS transport. At baseline, she is cognitively intact and ambulatory with a walker, without known focal neurologic deficits. Found to have a brain mass, seen by NS, poor surgical candidate. She was seen by palliative and transitioned to comfort care and reviewed by hospice.  Not a candidate for inpt hospice. Needs Placement to SNF vs home with hospice.   Assessment & Plan:  Principal Problem:   Left-sided weakness Active Problems:   Anxiety state   Hypothyroidism   Acute encephalopathy, left sided weakness: No severe metabolic derangements, most likely related to temporal mass with midline shift also explaining neurological deficit, also possibly post-concussive, and also possibly from polypharmacy. CK wnl.  - MRI:  Enhancing mass in the lateral right temporal lobe measuring 1.5 x 1.2 x 1.3 cm, with additional smaller enhancing nodules suggestive of satellite lesions.  Surrounding vasogenic edema and approximately 6 mm leftward midline shift. Findings suspicious for metastatic disease. - continue decadron  4 mg BID for now, can slowly wean down  - neurosurgery was consulted and she was deemed high risk surgical candidate. Met with palliative and transitioned to comfort care. Seen by hospice but not an inpatient hospice candidate - continue bowel regimen, continue PRN antiemetics  - comfort care measures, PRN ativan , haldol , robinul , morphine   -She is alert,awake and oriented at this time   Suspected Acute CVA but actually likely CLOC - seen on MRI - hold off on aspirin as she is now comfort care   Hypothyroidism: On  comfort care now.  Bradycardia: Dced metoprolol  on 1/4.  Disposition: SNF vs home with hospice.   DVT prophylaxis: Not indicated     Code Status: Do not attempt resuscitation (DNR) - Comfort care Family Communication:  None at the bedside Status is: Inpatient Remains inpatient appropriate because: pending placement    Subjective:  No acute events overnight.  No active complaints. Nausea has almost resolved today.  Examination:  General exam: Appears calm and comfortable  Respiratory system: Clear to auscultation. Respiratory effort normal. Cardiovascular system: S1 & S2 heard, RRR. No JVD, murmurs, rubs, gallops or clicks. No pedal edema. Gastrointestinal system: Abdomen is nondistended, soft and nontender. No organomegaly or masses felt. Normal bowel sounds heard. Central nervous system: Alert and oriented.  Extremities: Left sided weakness Skin: No rashes, lesions or ulcers      Diet Orders (From admission, onward)     Start     Ordered   05/27/24 0245  Diet regular Room service appropriate? Yes; Fluid consistency: Thin  Diet effective now       Question Answer Comment  Room service appropriate? Yes   Fluid consistency: Thin      05/27/24 0244            Objective: Vitals:   06/03/24 1455 06/03/24 2037 06/04/24 0412 06/04/24 0829  BP: (!) 157/55 (!) 154/50 (!) 143/49 (!) 145/52  Pulse: (!) 48 (!) 44 (!) 45 (!) 47  Resp:  20 20   Temp: 98 F (36.7 C) 97.9 F (36.6 C) 97.7 F (36.5 C) 97.6 F (36.4 C)  TempSrc: Oral Oral Oral Oral  SpO2: 96% 95% 93% 94%    Intake/Output  Summary (Last 24 hours) at 06/04/2024 0917 Last data filed at 06/04/2024 0410 Gross per 24 hour  Intake 483 ml  Output 300 ml  Net 183 ml   There were no vitals filed for this visit.  Scheduled Meds:  dexamethasone   4 mg Oral Q12H   lidocaine   1 patch Transdermal Q24H   oxybutynin   5 mg Oral Daily   polyethylene glycol  17 g Oral Daily   senna-docusate  1 tablet Oral BID    sodium chloride  flush  3 mL Intravenous Q12H   Continuous Infusions:  Nutritional status     There is no height or weight on file to calculate BMI.  Data Reviewed:   CBC: No results for input(s): WBC, NEUTROABS, HGB, HCT, MCV, PLT in the last 168 hours.  Basic Metabolic Panel: No results for input(s): NA, K, CL, CO2, GLUCOSE, BUN, CREATININE, CALCIUM , MG, PHOS in the last 168 hours.  GFR: CrCl cannot be calculated (Unknown ideal weight.). Liver Function Tests: No results for input(s): AST, ALT, ALKPHOS, BILITOT, PROT, ALBUMIN  in the last 168 hours.  No results for input(s): LIPASE, AMYLASE in the last 168 hours. No results for input(s): AMMONIA in the last 168 hours.  Coagulation Profile: No results for input(s): INR, PROTIME in the last 168 hours. Cardiac Enzymes: No results for input(s): CKTOTAL, CKMB, CKMBINDEX, TROPONINI in the last 168 hours. BNP (last 3 results) No results for input(s): PROBNP in the last 8760 hours. HbA1C: No results for input(s): HGBA1C in the last 72 hours. CBG: No results for input(s): GLUCAP in the last 168 hours.  Lipid Profile: No results for input(s): CHOL, HDL, LDLCALC, TRIG, CHOLHDL, LDLDIRECT in the last 72 hours. Thyroid Function Tests: No results for input(s): TSH, T4TOTAL, FREET4, T3FREE, THYROIDAB in the last 72 hours. Anemia Panel: No results for input(s): VITAMINB12, FOLATE, FERRITIN, TIBC, IRON, RETICCTPCT in the last 72 hours. Sepsis Labs: No results for input(s): PROCALCITON, LATICACIDVEN in the last 168 hours.   Recent Results (from the past 240 hours)  Resp panel by RT-PCR (RSV, Flu A&B, Covid) Anterior Nasal Swab     Status: None   Collection Time: 05/27/24  3:14 AM   Specimen: Anterior Nasal Swab  Result Value Ref Range Status   SARS Coronavirus 2 by RT PCR NEGATIVE NEGATIVE Final   Influenza A by PCR NEGATIVE  NEGATIVE Final   Influenza B by PCR NEGATIVE NEGATIVE Final    Comment: (NOTE) The Xpert Xpress SARS-CoV-2/FLU/RSV plus assay is intended as an aid in the diagnosis of influenza from Nasopharyngeal swab specimens and should not be used as a sole basis for treatment. Nasal washings and aspirates are unacceptable for Xpert Xpress SARS-CoV-2/FLU/RSV testing.  Fact Sheet for Patients: bloggercourse.com  Fact Sheet for Healthcare Providers: seriousbroker.it  This test is not yet approved or cleared by the [place]  FDA and has been authorized for detection and/or diagnosis of SARS-CoV-2 by FDA under an Emergency Use Authorization (EUA). This EUA will remain in effect (meaning this test can be used) for the duration of the COVID-19 declaration under Section 564(b)(1) of the Act, 21 U.S.C. section 360bbb-3(b)(1), unless the authorization is terminated or revoked.     Resp Syncytial Virus by PCR NEGATIVE NEGATIVE Final    Comment: (NOTE) Fact Sheet for Patients: bloggercourse.com  Fact Sheet for Healthcare Providers: seriousbroker.it  This test is not yet approved or cleared by the [place]  FDA and has been authorized for detection and/or diagnosis of SARS-CoV-2 by FDA under an  Emergency Use Authorization (EUA). This EUA will remain in effect (meaning this test can be used) for the duration of the COVID-19 declaration under Section 564(b)(1) of the Act, 21 U.S.C. section 360bbb-3(b)(1), unless the authorization is terminated or revoked.  Performed at [facility], [address], [place], [place] [address]          Radiology Studies: No results found.         LOS: 8 days   Time spent= 35 mins    Deliliah Room, MD [facility]  If 7PM-7AM, please contact night-coverage  06/04/2024, 9:17 AM  "
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
## 2024-06-04 NOTE — Plan of Care (Signed)
" °  Problem: Clinical Measurements: Goal: Diagnostic test results will improve Outcome: Progressing Goal: Respiratory complications will improve Outcome: Progressing Goal: Cardiovascular complication will be avoided Outcome: Progressing   Problem: Activity: Goal: Risk for activity intolerance will decrease Outcome: Progressing   Problem: Nutrition: Goal: Adequate nutrition will be maintained Outcome: Progressing   Problem: Coping: Goal: Level of anxiety will decrease Outcome: Progressing   Problem: Elimination: Goal: Will not experience complications related to bowel motility Outcome: Progressing Goal: Will not experience complications related to urinary retention Outcome: Progressing   Problem: Pain Managment: Goal: General experience of comfort will improve and/or be controlled Outcome: Progressing   Problem: Safety: Goal: Ability to remain free from injury will improve Outcome: Progressing   Problem: Skin Integrity: Goal: Risk for impaired skin integrity will decrease Outcome: Progressing   Problem: Education: Goal: Knowledge of disease or condition will improve Outcome: Progressing Goal: Knowledge of secondary prevention will improve (MUST DOCUMENT ALL) Outcome: Progressing Goal: Knowledge of patient specific risk factors will improve (DELETE if not current risk factor) Outcome: Progressing   Problem: Ischemic Stroke/TIA Tissue Perfusion: Goal: Complications of ischemic stroke/TIA will be minimized Outcome: Progressing   Problem: Coping: Goal: Will verbalize positive feelings about self Outcome: Progressing Goal: Will identify appropriate support needs Outcome: Progressing   Problem: Health Behavior/Discharge Planning: Goal: Ability to manage health-related needs will improve Outcome: Progressing Goal: Goals will be collaboratively established with patient/family Outcome: Progressing   Problem: Self-Care: Goal: Ability to participate in self-care as  condition permits will improve Outcome: Progressing Goal: Verbalization of feelings and concerns over difficulty with self-care will improve Outcome: Progressing Goal: Ability to communicate needs accurately will improve Outcome: Progressing   Problem: Nutrition: Goal: Risk of aspiration will decrease Outcome: Progressing Goal: Dietary intake will improve Outcome: Progressing   Problem: Education: Goal: Knowledge of the prescribed therapeutic regimen will improve Outcome: Progressing   Problem: Coping: Goal: Ability to identify and develop effective coping behavior will improve Outcome: Progressing   Problem: Clinical Measurements: Goal: Quality of life will improve Outcome: Progressing   Problem: Respiratory: Goal: Verbalizations of increased ease of respirations will increase Outcome: Progressing   Problem: Role Relationship: Goal: Family's ability to cope with current situation will improve Outcome: Progressing Goal: Ability to verbalize concerns, feelings, and thoughts to partner or family member will improve Outcome: Progressing   Problem: Pain Management: Goal: Satisfaction with pain management regimen will improve Outcome: Progressing   "

## 2024-06-04 NOTE — Progress Notes (Signed)
 [facility] Liaison Note   [facility] continues to follow for discharge disposition for hospice services.   Please call with any hospice related questions or concerns.   Eleanor Nail, LPN [facility] Liaison [phone]
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
## 2024-06-04 NOTE — Plan of Care (Signed)
" °  Problem: Pain Managment: Goal: General experience of comfort will improve and/or be controlled Outcome: Progressing   Problem: Education: Goal: Knowledge of disease or condition will improve Outcome: Progressing   Problem: Self-Care: Goal: Ability to participate in self-care as condition permits will improve Outcome: Progressing   Problem: Pain Management: Goal: Satisfaction with pain management regimen will improve Outcome: Progressing   "

## 2024-06-05 DIAGNOSIS — R531 Weakness: Secondary | ICD-10-CM | POA: Diagnosis not present
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
## 2024-06-05 NOTE — Progress Notes (Addendum)
 Patient DOB: June 27, 1930  To whom it may concern,  Please be advised that the above-named patient will require a short-term nursing home stay - anticipated 30 days or less for rehabilitation and strengthening.  The plan is for return home.

## 2024-06-05 NOTE — Plan of Care (Signed)
" °  Problem: Clinical Measurements: Goal: Diagnostic test results will improve Outcome: Progressing Goal: Respiratory complications will improve Outcome: Progressing Goal: Cardiovascular complication will be avoided Outcome: Progressing   Problem: Activity: Goal: Risk for activity intolerance will decrease Outcome: Progressing   Problem: Nutrition: Goal: Adequate nutrition will be maintained Outcome: Progressing   Problem: Coping: Goal: Level of anxiety will decrease Outcome: Progressing   Problem: Elimination: Goal: Will not experience complications related to bowel motility Outcome: Progressing Goal: Will not experience complications related to urinary retention Outcome: Progressing   Problem: Pain Managment: Goal: General experience of comfort will improve and/or be controlled Outcome: Progressing   Problem: Safety: Goal: Ability to remain free from injury will improve Outcome: Progressing   Problem: Skin Integrity: Goal: Risk for impaired skin integrity will decrease Outcome: Progressing   Problem: Education: Goal: Knowledge of disease or condition will improve Outcome: Progressing Goal: Knowledge of secondary prevention will improve (MUST DOCUMENT ALL) Outcome: Progressing Goal: Knowledge of patient specific risk factors will improve (DELETE if not current risk factor) Outcome: Progressing   Problem: Ischemic Stroke/TIA Tissue Perfusion: Goal: Complications of ischemic stroke/TIA will be minimized Outcome: Progressing   Problem: Coping: Goal: Will verbalize positive feelings about self Outcome: Progressing Goal: Will identify appropriate support needs Outcome: Progressing   Problem: Health Behavior/Discharge Planning: Goal: Ability to manage health-related needs will improve Outcome: Progressing Goal: Goals will be collaboratively established with patient/family Outcome: Progressing   Problem: Self-Care: Goal: Ability to participate in self-care as  condition permits will improve Outcome: Progressing Goal: Verbalization of feelings and concerns over difficulty with self-care will improve Outcome: Progressing Goal: Ability to communicate needs accurately will improve Outcome: Progressing   Problem: Nutrition: Goal: Risk of aspiration will decrease Outcome: Progressing Goal: Dietary intake will improve Outcome: Progressing   Problem: Education: Goal: Knowledge of the prescribed therapeutic regimen will improve Outcome: Progressing   Problem: Coping: Goal: Ability to identify and develop effective coping behavior will improve Outcome: Progressing   Problem: Clinical Measurements: Goal: Quality of life will improve Outcome: Progressing   Problem: Respiratory: Goal: Verbalizations of increased ease of respirations will increase Outcome: Progressing   Problem: Role Relationship: Goal: Family's ability to cope with current situation will improve Outcome: Progressing Goal: Ability to verbalize concerns, feelings, and thoughts to partner or family member will improve Outcome: Progressing   Problem: Pain Management: Goal: Satisfaction with pain management regimen will improve Outcome: Progressing   "

## 2024-06-05 NOTE — Progress Notes (Signed)
 Removed 22g IV in RFA due to leakage. Pt requested to not get another IV at this time. Pt has nothing scheduled IV.

## 2024-06-05 NOTE — TOC Progression Note (Signed)
 Transition of Care [facility]) - Progression Note    Patient Details  Name: [name] MRN: [id number] Date of Birth: [birth year]  Transition [facility]) CM/SW Contact  Sherline Clack, [place] Phone Number: 06/05/2024, 4:25 PM  Clinical Narrative:     CSW spoke with patient's sister regarding placement. Sister shared she will reach out to facilities today and will get back to CSW tomorrow by 11 am. CSW explained insurance process to sister and she expressed understanding. CSW will reach out tomorrow and start insurance auth for facility family chooses.   Expected Discharge Plan: Skilled Nursing Facility Barriers to Discharge: SNF Pending bed offer               Expected Discharge Plan and Services       Living arrangements for the past 2 months: Single Family Home                                       Social Drivers of Health (SDOH) Interventions SDOH Screenings   Food Insecurity: Patient Unable To Answer (05/28/2024)  Housing: Unknown (05/28/2024)  Transportation Needs: Patient Unable To Answer (05/28/2024)  Utilities: Patient Unable To Answer (05/28/2024)  Financial Resource Strain: Low Risk (07/12/2023)   Received from [facility]   Social Connections: Patient Unable To Answer (05/28/2024)  Tobacco Use: Low Risk (05/26/2024)    Readmission Risk Interventions     No data to display

## 2024-06-05 NOTE — Progress Notes (Signed)
 " PROGRESS NOTE    [name]  FMW:[id number] DOB: 1930/08/24 DOA: 05/26/2024 PCP: Pcp, No   Brief Narrative:  89 year old female with hypertension, hypothyroidism, GERD, chronic pain, and recurrent falls who presents with acute altered mental status and new left-sided weakness. Her sister found her this morning confused with new left arm and leg weakness, prompting EMS transport. At baseline, she is cognitively intact and ambulatory with a walker, without known focal neurologic deficits. Found to have a brain mass, seen by NS, poor surgical candidate. She was seen by palliative and transitioned to comfort care and reviewed by hospice.  Not a candidate for inpt hospice. Needs Placement to SNF   Assessment & Plan:  Principal Problem:   Left-sided weakness Active Problems:   Anxiety state   Hypothyroidism   Acute encephalopathy, left sided weakness: No severe metabolic derangements, most likely related to temporal mass with midline shift also explaining neurological deficit, also possibly post-concussive, and also possibly from polypharmacy. CK wnl.  - MRI:  Enhancing mass in the lateral right temporal lobe measuring 1.5 x 1.2 x 1.3 cm, with additional smaller enhancing nodules suggestive of satellite lesions.  Surrounding vasogenic edema and approximately 6 mm leftward midline shift. Findings suspicious for metastatic disease. - continue decadron  4 mg BID for now, can slowly wean down  - neurosurgery was consulted and she was deemed high risk surgical candidate. Met with palliative and transitioned to comfort care. Seen by hospice but not an inpatient hospice candidate - continue bowel regimen, continue PRN antiemetics  - comfort care measures, PRN ativan , haldol , robinul , morphine   -She is alert,awake and oriented at this time   Suspected Acute CVA but actually likely CLOC - seen on MRI - hold off on aspirin as she is now comfort care   Hypothyroidism: On comfort care  now.  Bradycardia: Dced metoprolol  on 1/4.  Disposition: Pending SNF   DVT prophylaxis: Not indicated     Code Status: Do not attempt resuscitation (DNR) - Comfort care Family Communication:  None at the bedside Status is: Inpatient Remains inpatient appropriate because: pending placement    Subjective:  No acute events overnight.  No active complaints.   Examination:  General exam: Appears calm and comfortable  Respiratory system: Clear to auscultation. Respiratory effort normal. Cardiovascular system: S1 & S2 heard, RRR. No JVD, murmurs, rubs, gallops or clicks. No pedal edema. Gastrointestinal system: Abdomen is nondistended, soft and nontender. No organomegaly or masses felt. Normal bowel sounds heard. Central nervous system: Alert and oriented.  Extremities: Left sided weakness Skin: No rashes, lesions or ulcers      Diet Orders (From admission, onward)     Start     Ordered   05/27/24 0245  Diet regular Room service appropriate? Yes; Fluid consistency: Thin  Diet effective now       Question Answer Comment  Room service appropriate? Yes   Fluid consistency: Thin      05/27/24 0244            Objective: Vitals:   06/04/24 0829 06/04/24 1629 06/04/24 2355 06/05/24 0820  BP: (!) 145/52 (!) 152/66 (!) 148/78 (!) 151/51  Pulse: (!) 47 63 (!) 49 (!) 48  Resp:   18 15  Temp: 97.6 F (36.4 C) 98.2 F (36.8 C) 97.8 F (36.6 C) (!) 97.5 F (36.4 C)  TempSrc: Oral Oral Oral Oral  SpO2: 94% 92% 95% 97%    Intake/Output Summary (Last 24 hours) at 06/05/2024 1035 Last data filed at  06/05/2024 0648 Gross per 24 hour  Intake 3 ml  Output 800 ml  Net -797 ml   There were no vitals filed for this visit.  Scheduled Meds:  dexamethasone   4 mg Oral Q12H   lidocaine   1 patch Transdermal Q24H   oxybutynin   5 mg Oral Daily   polyethylene glycol  17 g Oral Daily   senna-docusate  1 tablet Oral BID   sodium chloride  flush  3 mL Intravenous Q12H   Continuous  Infusions:  Nutritional status     There is no height or weight on file to calculate BMI.  Data Reviewed:   CBC: No results for input(s): WBC, NEUTROABS, HGB, HCT, MCV, PLT in the last 168 hours.  Basic Metabolic Panel: No results for input(s): NA, K, CL, CO2, GLUCOSE, BUN, CREATININE, CALCIUM , MG, PHOS in the last 168 hours.  GFR: CrCl cannot be calculated (Unknown ideal weight.). Liver Function Tests: No results for input(s): AST, ALT, ALKPHOS, BILITOT, PROT, ALBUMIN  in the last 168 hours.  No results for input(s): LIPASE, AMYLASE in the last 168 hours. No results for input(s): AMMONIA in the last 168 hours.  Coagulation Profile: No results for input(s): INR, PROTIME in the last 168 hours. Cardiac Enzymes: No results for input(s): CKTOTAL, CKMB, CKMBINDEX, TROPONINI in the last 168 hours. BNP (last 3 results) No results for input(s): PROBNP in the last 8760 hours. HbA1C: No results for input(s): HGBA1C in the last 72 hours. CBG: No results for input(s): GLUCAP in the last 168 hours.  Lipid Profile: No results for input(s): CHOL, HDL, LDLCALC, TRIG, CHOLHDL, LDLDIRECT in the last 72 hours. Thyroid Function Tests: No results for input(s): TSH, T4TOTAL, FREET4, T3FREE, THYROIDAB in the last 72 hours. Anemia Panel: No results for input(s): VITAMINB12, FOLATE, FERRITIN, TIBC, IRON, RETICCTPCT in the last 72 hours. Sepsis Labs: No results for input(s): PROCALCITON, LATICACIDVEN in the last 168 hours.   Recent Results (from the past 240 hours)  Resp panel by RT-PCR (RSV, Flu A&B, Covid) Anterior Nasal Swab     Status: None   Collection Time: 05/27/24  3:14 AM   Specimen: Anterior Nasal Swab  Result Value Ref Range Status   SARS Coronavirus 2 by RT PCR NEGATIVE NEGATIVE Final   Influenza A by PCR NEGATIVE NEGATIVE Final   Influenza B by PCR NEGATIVE NEGATIVE Final     Comment: (NOTE) The Xpert Xpress SARS-CoV-2/FLU/RSV plus assay is intended as an aid in the diagnosis of influenza from Nasopharyngeal swab specimens and should not be used as a sole basis for treatment. Nasal washings and aspirates are unacceptable for Xpert Xpress SARS-CoV-2/FLU/RSV testing.  Fact Sheet for Patients: bloggercourse.com  Fact Sheet for Healthcare Providers: seriousbroker.it  This test is not yet approved or cleared by the [place]  FDA and has been authorized for detection and/or diagnosis of SARS-CoV-2 by FDA under an Emergency Use Authorization (EUA). This EUA will remain in effect (meaning this test can be used) for the duration of the COVID-19 declaration under Section 564(b)(1) of the Act, 21 U.S.C. section 360bbb-3(b)(1), unless the authorization is terminated or revoked.     Resp Syncytial Virus by PCR NEGATIVE NEGATIVE Final    Comment: (NOTE) Fact Sheet for Patients: bloggercourse.com  Fact Sheet for Healthcare Providers: seriousbroker.it  This test is not yet approved or cleared by the [place]  FDA and has been authorized for detection and/or diagnosis of SARS-CoV-2 by FDA under an Emergency Use Authorization (EUA). This EUA will remain in effect (meaning  this test can be used) for the duration of the COVID-19 declaration under Section 564(b)(1) of the Act, 21 U.S.C. section 360bbb-3(b)(1), unless the authorization is terminated or revoked.  Performed at [facility], [address], [place], [place] [address]          Radiology Studies: No results found.         LOS: 9 days   Time spent= 35 mins    Deliliah Room, MD [facility]  If 7PM-7AM, please contact night-coverage  06/05/2024, 10:35 AM  "

## 2024-06-06 MED ORDER — HYDROCODONE-ACETAMINOPHEN 5-325 MG PO TABS: 1.0000 | ORAL_TABLET | Freq: Four times a day (QID) | ORAL | 0 refills | Status: DC | PRN

## 2024-06-06 MED ORDER — LORAZEPAM 1 MG PO TABS: 1.0000 mg | ORAL_TABLET | ORAL | 0 refills | Status: DC | PRN

## 2024-06-06 NOTE — Progress Notes (Signed)
 PT Cancellation Note  Patient Details Name: [name] MRN: [id number] DOB: 11/20/1930   Cancelled Treatment:    Reason Eval/Treat Not Completed: Patient declined, no reason specified. Pt reports she moved around too much yesterday and it made her sick. Requesting to rest today.    Erven Sari Shaker 06/06/2024, 9:58 AM

## 2024-06-06 NOTE — Progress Notes (Signed)
 " PROGRESS NOTE    [name]  FMW:[id number] DOB: 1930-09-18 DOA: 05/26/2024 PCP: Pcp, No   Brief Narrative:  89 year old female with hypertension, hypothyroidism, GERD, chronic pain, and recurrent falls who presents with acute altered mental status and new left-sided weakness. Her sister found her this morning confused with new left arm and leg weakness, prompting EMS transport. At baseline, she is cognitively intact and ambulatory with a walker, without known focal neurologic deficits. Found to have a brain mass, seen by NS, poor surgical candidate. She was seen by palliative and transitioned to comfort care and reviewed by hospice.  Not a candidate for inpt hospice. Needs Placement to SNF   Assessment & Plan:  Principal Problem:   Left-sided weakness Active Problems:   Anxiety state   Hypothyroidism   Acute encephalopathy, left sided weakness: No severe metabolic derangements, most likely related to temporal mass with midline shift also explaining neurological deficit, also possibly post-concussive, and also possibly from polypharmacy. CK wnl.  - MRI:  Enhancing mass in the lateral right temporal lobe measuring 1.5 x 1.2 x 1.3 cm, with additional smaller enhancing nodules suggestive of satellite lesions.  Surrounding vasogenic edema and approximately 6 mm leftward midline shift. Findings suspicious for metastatic disease. - continue decadron  4 mg BID for now, can slowly wean down  - neurosurgery was consulted and she was deemed high risk surgical candidate. Met with palliative and transitioned to comfort care. Seen by hospice but not an inpatient hospice candidate - continue bowel regimen, continue PRN antiemetics  - comfort care measures, PRN ativan , haldol , robinul , morphine   -She is alert,awake and oriented at this time   Suspected Acute CVA but actually likely CLOC - seen on MRI - hold off on aspirin as she is now comfort care   Hypothyroidism: On comfort care  now.  Bradycardia: Dced metoprolol  on 1/4.  Disposition: Pending SNF   DVT prophylaxis: Not indicated     Code Status: Do not attempt resuscitation (DNR) - Comfort care Family Communication:  None at the bedside Status is: Inpatient Remains inpatient appropriate because: pending placement    Subjective:  No acute events overnight.  No active complaints.   Examination:  General exam: Appears calm and comfortable  Respiratory system: Clear to auscultation. Respiratory effort normal. Cardiovascular system: S1 & S2 heard, RRR. No JVD, murmurs, rubs, gallops or clicks. No pedal edema. Gastrointestinal system: Abdomen is nondistended, soft and nontender. No organomegaly or masses felt. Normal bowel sounds heard. Central nervous system: Alert and oriented.  Extremities: Left sided weakness Skin: No rashes, lesions or ulcers      Diet Orders (From admission, onward)     Start     Ordered   05/27/24 0245  Diet regular Room service appropriate? Yes; Fluid consistency: Thin  Diet effective now       Question Answer Comment  Room service appropriate? Yes   Fluid consistency: Thin      05/27/24 0244            Objective: Vitals:   06/04/24 2355 06/05/24 0820 06/06/24 0431 06/06/24 0805  BP: (!) 148/78 (!) 151/51 (!) 150/56 (!) 150/62  Pulse: (!) 49 (!) 48 (!) 50 60  Resp: 18 15 16 18   Temp: 97.8 F (36.6 C) (!) 97.5 F (36.4 C) 98.6 F (37 C) 98.6 F (37 C)  TempSrc: Oral Oral Oral Oral  SpO2: 95% 97%  99%    Intake/Output Summary (Last 24 hours) at 06/06/2024 1138 Last data filed at  06/05/2024 1900 Gross per 24 hour  Intake 240 ml  Output --  Net 240 ml   There were no vitals filed for this visit.  Scheduled Meds:  dexamethasone   4 mg Oral Q12H   lidocaine   1 patch Transdermal Q24H   oxybutynin   5 mg Oral Daily   polyethylene glycol  17 g Oral Daily   senna-docusate  1 tablet Oral BID   sodium chloride  flush  3 mL Intravenous Q12H   Continuous  Infusions:  Nutritional status     There is no height or weight on file to calculate BMI.  Data Reviewed:   CBC: No results for input(s): WBC, NEUTROABS, HGB, HCT, MCV, PLT in the last 168 hours.  Basic Metabolic Panel: No results for input(s): NA, K, CL, CO2, GLUCOSE, BUN, CREATININE, CALCIUM , MG, PHOS in the last 168 hours.  GFR: CrCl cannot be calculated (Unknown ideal weight.). Liver Function Tests: No results for input(s): AST, ALT, ALKPHOS, BILITOT, PROT, ALBUMIN  in the last 168 hours.  No results for input(s): LIPASE, AMYLASE in the last 168 hours. No results for input(s): AMMONIA in the last 168 hours.  Coagulation Profile: No results for input(s): INR, PROTIME in the last 168 hours. Cardiac Enzymes: No results for input(s): CKTOTAL, CKMB, CKMBINDEX, TROPONINI in the last 168 hours. BNP (last 3 results) No results for input(s): PROBNP in the last 8760 hours. HbA1C: No results for input(s): HGBA1C in the last 72 hours. CBG: No results for input(s): GLUCAP in the last 168 hours.  Lipid Profile: No results for input(s): CHOL, HDL, LDLCALC, TRIG, CHOLHDL, LDLDIRECT in the last 72 hours. Thyroid Function Tests: No results for input(s): TSH, T4TOTAL, FREET4, T3FREE, THYROIDAB in the last 72 hours. Anemia Panel: No results for input(s): VITAMINB12, FOLATE, FERRITIN, TIBC, IRON, RETICCTPCT in the last 72 hours. Sepsis Labs: No results for input(s): PROCALCITON, LATICACIDVEN in the last 168 hours.   No results found for this or any previous visit (from the past 240 hours).        Radiology Studies: No results found.         LOS: 10 days   Time spent= 35 mins    Deliliah Room, MD [facility]  If 7PM-7AM, please contact night-coverage  06/06/2024, 11:38 AM  "

## 2024-06-06 NOTE — Plan of Care (Signed)
  Problem: Clinical Measurements: Goal: Diagnostic test results will improve Outcome: Progressing Goal: Respiratory complications will improve Outcome: Progressing Goal: Cardiovascular complication will be avoided Outcome: Progressing   Problem: Activity: Goal: Risk for activity intolerance will decrease Outcome: Progressing   Problem: Nutrition: Goal: Adequate nutrition will be maintained Outcome: Progressing   Problem: Coping: Goal: Level of anxiety will decrease Outcome: Progressing   

## 2024-06-07 MED ORDER — ONDANSETRON 4 MG PO TBDP
4.0000 mg | ORAL_TABLET | Freq: Three times a day (TID) | ORAL | Status: DC | PRN
  Administered 2024-06-09 – 2024-06-11 (×4): 4 mg via ORAL
  Filled 2024-06-07 (×4): qty 1

## 2024-06-07 NOTE — Progress Notes (Signed)
 Physical Therapy Treatment Patient Details Name: [name] MRN: [id number] DOB: 09/17/1930 Today's Date: 06/07/2024   History of Present Illness 89 y.o. F who presented to [facility] 05/26/24 d/t to AMS and L side weakness. MRI showed right temporal mass c/f metastatic disease vs. primary malignant brain tumor. Pt and Sister do not want any chemotherapy or radiation treatments. Palliative care following. PMHx: HTN, GERD, hypothyroidism, chronic pain, falls.    PT Comments  Agreeable to work with therapy but ultimately limited by nausea, confusion, and fear of falling. Required mod assist to roll in bed but was able to bridge and scoot her self up to [facility] with use of rails. Mod assist initially to rise for trunk support, practiced a second time after nausea settled and performed at a light min assist level with HOB elevated and using rail as instructed. CGA to return to bed. Tolerated sitting EOB with supervision- CGA level assist. Patient will continue to benefit from skilled physical therapy services to further improve independence with functional mobility.    If plan is discharge home, recommend the following: Assist for transportation;Help with stairs or ramp for entrance;Supervision due to cognitive status;A lot of help with walking and/or transfers;A lot of help with bathing/dressing/bathroom;Assistance with cooking/housework   Can travel by private vehicle     No  Equipment Recommendations  Wheelchair (measurements PT);Hoyer lift;Hospital bed;Wheelchair cushion (measurements PT);BSC/3in1    Recommendations for Other Services       Precautions / Restrictions Precautions Precautions: Fall Recall of Precautions/Restrictions: Impaired Restrictions Weight Bearing Restrictions Per Provider Order: No     Mobility  Bed Mobility Overal bed mobility: Needs Assistance Bed Mobility: Supine to Sit, Sit to Supine, Rolling Rolling: Mod assist, Used rails   Supine to sit: Mod assist, HOB  elevated, Used rails, Min assist Sit to supine: Contact guard assist   General bed mobility comments: Mod assist to roll with cues for rail use, she was able to bridge herself and scoot up in bed, but difficulty aligning in midline. Mod assist initially to pull up through therapist's hand to rise to EOB, quickly falling back reporting nausea. Second attempt only min assist to rise and scoot to edge of bed. CGA for safety to return to bed safely.    Transfers                   General transfer comment: Limited by nausea, confusion, and fear of falling.    Ambulation/Gait               General Gait Details: Unable at this time.   Stairs             Wheelchair Mobility     Tilt Bed    Modified Rankin (Stroke Patients Only)       Balance Overall balance assessment: Needs assistance Sitting-balance support: No upper extremity supported, Feet supported Sitting balance-Leahy Scale: Fair Sitting balance - Comments: CGA                                    Communication Communication Communication: No apparent difficulties  Cognition Arousal: Alert Behavior During Therapy: WFL for tasks assessed/performed, Anxious   PT - Cognitive impairments: No family/caregiver present to determine baseline, Awareness, Memory, Sequencing, Problem solving, Safety/Judgement                       PT - Cognition Comments:  Reports hesitant to move due to nausea - states she does better if she does the movement rather than being assisted. Following commands: Impaired Following commands impaired: Follows one step commands with increased time, Follows one step commands inconsistently    Cueing Cueing Techniques: Verbal cues, Gestural cues, Tactile cues, Visual cues  Exercises      General Comments General comments (skin integrity, edema, etc.): Reports nausea, worse with movement, states she cannot tolerate recliner today, agreeable to sit upright more  in bed for tolerance.      Pertinent Vitals/Pain Pain Assessment Pain Assessment: No/denies pain    Home Living                          Prior Function            PT Goals (current goals can now be found in the care plan section) Acute Rehab PT Goals PT Goal Formulation: Patient unable to participate in goal setting Time For Goal Achievement: 06/17/24 Potential to Achieve Goals: Fair Progress towards PT goals: Progressing toward goals    Frequency    Min 2X/week      PT Plan      Co-evaluation              AM-PAC PT 6 Clicks Mobility   Outcome Measure  Help needed turning from your back to your side while in a flat bed without using bedrails?: A Lot Help needed moving from lying on your back to sitting on the side of a flat bed without using bedrails?: A Lot Help needed moving to and from a bed to a chair (including a wheelchair)?: Total Help needed standing up from a chair using your arms (e.g., wheelchair or bedside chair)?: Total Help needed to walk in hospital room?: Total Help needed climbing 3-5 steps with a railing? : Total 6 Click Score: 8    End of Session   Activity Tolerance: Patient tolerated treatment well Patient left: in bed;with call bell/phone within reach;with bed alarm set;Other (comment) (placed in modified chair position) Nurse Communication: Mobility status;Need for lift equipment PT Visit Diagnosis: Unsteadiness on feet (R26.81);Other abnormalities of gait and mobility (R26.89);Muscle weakness (generalized) (M62.81);Difficulty in walking, not elsewhere classified (R26.2)     Time: 8993-8964 PT Time Calculation (min) (ACUTE ONLY): 29 min  Charges:    $Therapeutic Activity: 8-22 mins PT General Charges $$ ACUTE PT VISIT: 1 Visit                     Leontine Roads, PT, DPT [facility]  Rehabilitation Services Physical Therapist Office: [phone] Website: [email or web address]    Leontine GORMAN Roads 06/07/2024, 10:50  AM

## 2024-06-07 NOTE — Progress Notes (Signed)
 [facility] Liaison Note   [facility] continues to follow for discharge disposition for hospice services at LTC or SNF.   Please call with any hospice related questions or concerns.   Eleanor Nail, LPN [facility] Liaison [phone]

## 2024-06-07 NOTE — Progress Notes (Addendum)
 " PROGRESS NOTE  [name]  FMW:[id number] DOB: December 13, 1930 DOA: 05/26/2024 PCP: Pcp, No  Consultants  Brief Narrative: 89 year old female with hypertension, hypothyroidism, GERD, chronic pain, and recurrent falls who presents with acute altered mental status and new left-sided weakness. Her sister found her morning of admission confused with new left arm and leg weakness, prompting EMS transport. At baseline, she is cognitively intact and ambulatory with a walker, without known focal neurologic deficits. Found to have a brain mass, seen by NS, poor surgical candidate. She was seen by palliative and transitioned to comfort care and reviewed by hospice.   Not a candidate for inpt hospice. Needs Placement to SNF-->remains pending.   Assessment & Plan:  Principal Problem:   Left-sided weakness Active Problems:   Anxiety state   Hypothyroidism   Acute encephalopathy, left sided weakness: No severe metabolic derangements, most likely related to temporal mass with midline shift also explaining neurological deficit, also possibly post-concussive, and also possibly from polypharmacy. CK wnl.  - MRI:  Enhancing mass in the lateral right temporal lobe measuring 1.5 x 1.2 x 1.3 cm, with additional smaller enhancing nodules suggestive of satellite lesions.  Surrounding vasogenic edema and approximately 6 mm leftward midline shift. Findings suspicious for metastatic disease. - continue decadron  4 mg BID for now, can slowly wean down  - neurosurgery was consulted and she was deemed high risk surgical candidate. Met with palliative and transitioned to comfort care. Seen by hospice but not an inpatient hospice candidate - continue bowel regimen, continue PRN antiemetics  - comfort care measures, PRN ativan , haldol , robinul , morphine   -She is alert and awake, but not oriented to place or time.   Suspected Acute CVA but actually likely CLOC - seen on MRI - hold off on aspirin as she is now comfort care    Hypothyroidism: On comfort care now.   Bradycardia: Resolved, metoprolol  DC'ed on 1/4.   Disposition: Pending SNF     DVT prophylaxis:  not indicated   Code Status:   Code Status: Do not attempt resuscitation (DNR) - Comfort care Level of care: Med-Surg Status is: Inpatient Remains inpatient appropriate because: because awaiting placement.     Subjective: No complaints or concerns at this time.   Objective: Vitals:   06/06/24 0431 06/06/24 0805 06/06/24 2031 06/07/24 0800  BP: (!) 150/56 (!) 150/62 (!) 142/68 135/66  Pulse: (!) 50 60 71 67  Resp: 16 18 18    Temp: 98.6 F (37 C) 98.6 F (37 C) 98 F (36.7 C) (!) 96.7 F (35.9 C)  TempSrc: Oral Oral    SpO2:  99% 96% 96%   No intake or output data in the 24 hours ending 06/07/24 1239 There were no vitals filed for this visit. There is no height or weight on file to calculate BMI.  Gen: 89 y.o. female in no apparent distress, appears younger than stated age.  Nontoxic Pulm: Non-labored breathing.  Clear to auscultation bilaterally.  CV: Regular rate and rhythm. No murmur, rub, or gallop. No JVD GI: Abdomen soft, non-tender, non-distended, with normoactive bowel sounds. No organomegaly or masses felt. Ext: Warm, no deformities,  Skin: No rashes, lesions  Neuro: Alert but only oriented to person.  Left-sided weakness persists Psych: Calm, not anxious   I have personally reviewed the following labs and images: CBC: No results for input(s): WBC, NEUTROABS, HGB, HCT, MCV, PLT in the last 168 hours. BMP &GFR No results for input(s): NA, K, CL, CO2, GLUCOSE, BUN, CREATININE, CALCIUM , MG,  PHOS in the last 168 hours.  Invalid input(s): GFRCG CrCl cannot be calculated (Unknown ideal weight.). Liver & Pancreas: No results for input(s): AST, ALT, ALKPHOS, BILITOT, PROT, ALBUMIN  in the last 168 hours. No results for input(s): LIPASE, AMYLASE in the last 168 hours. No  results for input(s): AMMONIA in the last 168 hours. Diabetic: No results for input(s): HGBA1C in the last 72 hours. No results for input(s): GLUCAP in the last 168 hours. Cardiac Enzymes: No results for input(s): CKTOTAL, CKMB, CKMBINDEX, TROPONINI in the last 168 hours. No results for input(s): PROBNP in the last 8760 hours. Coagulation Profile: No results for input(s): INR, PROTIME in the last 168 hours. Thyroid Function Tests: No results for input(s): TSH, T4TOTAL, FREET4, T3FREE, THYROIDAB in the last 72 hours. Lipid Profile: No results for input(s): CHOL, HDL, LDLCALC, TRIG, CHOLHDL, LDLDIRECT in the last 72 hours. Anemia Panel: No results for input(s): VITAMINB12, FOLATE, FERRITIN, TIBC, IRON, RETICCTPCT in the last 72 hours. Urine analysis:    Component Value Date/Time   COLORURINE YELLOW 05/26/2024 1653   APPEARANCEUR CLOUDY (A) 05/26/2024 1653   LABSPEC 1.025 05/26/2024 1653   PHURINE 5.0 05/26/2024 1653   GLUCOSEU NEGATIVE 05/26/2024 1653   HGBUR NEGATIVE 05/26/2024 1653   BILIRUBINUR NEGATIVE 05/26/2024 1653   KETONESUR NEGATIVE 05/26/2024 1653   PROTEINUR 30 (A) 05/26/2024 1653   UROBILINOGEN 1.0 07/11/2013 0905   NITRITE NEGATIVE 05/26/2024 1653   LEUKOCYTESUR SMALL (A) 05/26/2024 1653   Sepsis Labs: Invalid input(s): PROCALCITONIN, LACTICIDVEN  Microbiology: No results found for this or any previous visit (from the past 240 hours).  Radiology Studies: No results found.  Scheduled Meds:  dexamethasone   4 mg Oral Q12H   lidocaine   1 patch Transdermal Q24H   oxybutynin   5 mg Oral Daily   polyethylene glycol  17 g Oral Daily   senna-docusate  1 tablet Oral BID   sodium chloride  flush  3 mL Intravenous Q12H   Continuous Infusions:   LOS: 11 days   35 minutes with more than 50% spent in reviewing records, counseling patient/family and coordinating care.  Reyes VEAR Gaw, MD [facility] [email or web address] 06/07/2024, 12:39 PM    "

## 2024-06-07 NOTE — Plan of Care (Signed)
  Problem: Pain Managment: Goal: General experience of comfort will improve and/or be controlled Outcome: Progressing   Problem: Safety: Goal: Ability to remain free from injury will improve Outcome: Progressing

## 2024-06-07 NOTE — Plan of Care (Signed)
   Problem: Clinical Measurements: Goal: Diagnostic test results will improve Outcome: Progressing   Problem: Activity: Goal: Risk for activity intolerance will decrease Outcome: Progressing   Problem: Nutrition: Goal: Adequate nutrition will be maintained Outcome: Progressing

## 2024-06-07 NOTE — TOC Progression Note (Addendum)
 Transition of Care [facility]) - Progression Note    Patient Details  Name: [name] MRN: [id number] Date of Birth: 02-22-1931  Transition of Care [facility]) CM/SW Contact  Sherline Clack, [place] Phone Number: 06/07/2024, 12:35 PM  Clinical Narrative:     Patient's shara is pending at this time for Karrin shara ID: [id number]. CSW will continue to monitor. Discharge plan at this time is for patient to go to SNF will palliative following.   Expected Discharge Plan: Skilled Nursing Facility Barriers to Discharge: SNF Pending bed offer              Expected Discharge Plan and Services       Living arrangements for the past 2 months: Single Family Home                                       Social Drivers of Health (SDOH) Interventions SDOH Screenings   Food Insecurity: Patient Unable To Answer (05/28/2024)  Housing: Unknown (05/28/2024)  Transportation Needs: Patient Unable To Answer (05/28/2024)  Utilities: Patient Unable To Answer (05/28/2024)  Financial Resource Strain: Low Risk (07/12/2023)   Received from [facility]   Social Connections: Patient Unable To Answer (05/28/2024)  Tobacco Use: Low Risk (05/26/2024)    Readmission Risk Interventions     No data to display

## 2024-06-08 DIAGNOSIS — R509 Fever, unspecified: Secondary | ICD-10-CM

## 2024-06-08 NOTE — Progress Notes (Signed)
 [facility]  PROGRESS NOTE  [name] FMW:[id number] DOB: 07-19-1930 DOA: 05/26/2024 PCP: Pcp, No   Brief HPI:   89 year old female with hypertension, hypothyroidism, GERD, chronic pain, and recurrent falls who presents with acute altered mental status and new left-sided weakness. Her sister found her morning of admission confused with new left arm and leg weakness, prompting EMS transport. At baseline, she is cognitively intact and ambulatory with a walker, without known focal neurologic deficits. Found to have a brain mass, seen by NS, poor surgical candidate. She was seen by palliative and transitioned to comfort care and reviewed by hospice.     Assessment/Plan:   Acute encephalopathy, left sided weakness:  -No severe metabolic derangements, most likely related to temporal mass with midline shift also explaining neurological deficit, also possibly post-concussive, and also possibly from polypharmacy. CK wnl.  - MRI:  Enhancing mass in the lateral right temporal lobe measuring 1.5 x 1.2 x 1.3 cm, with additional smaller enhancing nodules suggestive of satellite lesions.  Surrounding vasogenic edema and approximately 6 mm leftward midline shift. Findings suspicious for metastatic disease. - continue decadron  4 mg BID for now, can slowly wean down  - neurosurgery was consulted and she was deemed high risk surgical candidate.  - met with palliative and transitioned to comfort care. Seen by hospice but not an inpatient hospice candidate - continue bowel regimen, continue PRN antiemetics  - comfort care measures, PRN ativan , haldol , robinul , morphine      Suspected Acute CVA  - seen on MRI - hold off on aspirin as she is now comfort care   Hypothyroidism:  -On comfort care now.   Bradycardia:  -Resolved, metoprolol  DC'ed on 1/4.      DVT prophylaxis: Yes  Medications     dexamethasone   4 mg Oral Q12H   lidocaine   1 patch Transdermal Q24H   oxybutynin   5 mg Oral Daily    polyethylene glycol  17 g Oral Daily   senna-docusate  1 tablet Oral BID   sodium chloride  flush  3 mL Intravenous Q12H     Data Reviewed:   CBG:  No results for input(s): GLUCAP in the last 168 hours.  SpO2: 99 % O2 Flow Rate (L/min): 2.5 L/min    Vitals:   06/06/24 2031 06/07/24 0800 06/07/24 2026 06/08/24 0802  BP: (!) 142/68 135/66 (!) 160/66 (!) 146/52  Pulse: 71 67 60 62  Resp: 18  18 16   Temp: 98 F (36.7 C) (!) 96.7 F (35.9 C) 97.7 F (36.5 C) 97.8 F (36.6 C)  TempSrc:    Oral  SpO2: 96% 96% 98% 99%      Data Reviewed:   Antibiotics: Anti-infectives (From admission, onward)    Start     Dose/Rate Route Frequency Ordered Stop   05/26/24 1830  cefTRIAXone  (ROCEPHIN ) 1 g in sodium chloride  0.9 % 100 mL IVPB        1 g 200 mL/hr over 30 Minutes Intravenous  Once 05/26/24 1826 05/26/24 1909   05/26/24 1830  azithromycin  (ZITHROMAX ) 500 mg in sodium chloride  0.9 % 250 mL IVPB        500 mg 250 mL/hr over 60 Minutes Intravenous  Once 05/26/24 1826 05/26/24 2022        CONSULTS SCDs  Code Status: DNR/comfort  Family Communication: No family at bedside     Subjective   Denies any complaints.  No pain   Objective    Physical Examination:   General-appears in no acute distress  Heart-S1-S2, regular, no murmur auscultated Lungs-clear to auscultation bilaterally, no wheezing or crackles auscultated Abdomen-soft, nontender, no organomegaly Extremities-no edema in the lower extremities Neuro-alert, oriented x3, left-sided weakness            [name]   [facility] If 7PM-7AM, please contact night-coverage at [email or web address], Office  [phone]   06/08/2024, 9:17 AM  LOS: 12 days

## 2024-06-08 NOTE — TOC Progression Note (Signed)
 Transition of Care [facility]) - Progression Note    Patient Details  Name: [name] MRN: [id number] Date of Birth: 07-Aug-1930  Transition of Care [facility]) CM/SW Contact  Sherline Clack, [place] Phone Number: 06/08/2024, 3:50 PM  Clinical Narrative:     CSW received call from insurance asking if patient was planning to go to SNF for hospice vs skilled nursing. CSW informed insurance patient is planning on going to SNF to get stronger while being followed by palliative. CSW will continue to monitor insurance auth and update DC plan.   Expected Discharge Plan: Skilled Nursing Facility Barriers to Discharge: SNF Pending bed offer               Expected Discharge Plan and Services       Living arrangements for the past 2 months: Single Family Home                                       Social Drivers of Health (SDOH) Interventions SDOH Screenings   Food Insecurity: Patient Unable To Answer (05/28/2024)  Housing: Unknown (05/28/2024)  Transportation Needs: Patient Unable To Answer (05/28/2024)  Utilities: Patient Unable To Answer (05/28/2024)  Financial Resource Strain: Low Risk (07/12/2023)   Received from [facility]   Social Connections: Patient Unable To Answer (05/28/2024)  Tobacco Use: Low Risk (05/26/2024)    Readmission Risk Interventions     No data to display

## 2024-06-08 NOTE — Plan of Care (Signed)
" °  Problem: Clinical Measurements: Goal: Diagnostic test results will improve Outcome: Progressing   Problem: Nutrition: Goal: Adequate nutrition will be maintained Outcome: Progressing   Problem: Coping: Goal: Level of anxiety will decrease Outcome: Progressing   Problem: Elimination: Goal: Will not experience complications related to bowel motility Outcome: Progressing   Problem: Safety: Goal: Ability to remain free from injury will improve Outcome: Progressing   Problem: Nutrition: Goal: Risk of aspiration will decrease Outcome: Progressing   "

## 2024-06-08 NOTE — Progress Notes (Signed)
 [facility] Liaison Note   [facility] continues to follow for discharge disposition for hospice services at LTC or SNF.   Please call with any hospice related questions or concerns.   Eleanor Nail, LPN [facility] Liaison [phone]

## 2024-06-08 NOTE — Plan of Care (Signed)
   Problem: Activity: Goal: Risk for activity intolerance will decrease Outcome: Progressing   Problem: Nutrition: Goal: Adequate nutrition will be maintained Outcome: Progressing   Problem: Coping: Goal: Level of anxiety will decrease Outcome: Progressing   Problem: Safety: Goal: Ability to remain free from injury will improve Outcome: Progressing

## 2024-06-09 NOTE — Plan of Care (Signed)
" °  Problem: Pain Management: Goal: Satisfaction with pain management regimen will improve Outcome: Progressing   Problem: Activity: Goal: Risk for activity intolerance will decrease Outcome: Not Progressing   Problem: Skin Integrity: Goal: Risk for impaired skin integrity will decrease Outcome: Not Progressing   Problem: Education: Goal: Knowledge of disease or condition will improve Outcome: Not Progressing   Problem: Self-Care: Goal: Ability to participate in self-care as condition permits will improve Outcome: Not Progressing   Problem: Education: Goal: Knowledge of the prescribed therapeutic regimen will improve Outcome: Not Progressing   "

## 2024-06-09 NOTE — Progress Notes (Signed)
 Limited participation in Assessment d/t Pt Refusal.  Pt refused Rx's & q2h Turns.  Pt acknowledges benefits & risks RE:  Plan of Care (& refusal thereof) & states I'm comfortable & want to be left alone.  Pt remains DNR-Comfort.  Callbell within reach, lights dimmed for comfort.  Pt appears in no acute distress. Will continue to monitor.

## 2024-06-09 NOTE — Progress Notes (Signed)
 [facility]  PROGRESS NOTE  [name] FMW:[id number] DOB: 05/26/1931 DOA: 05/26/2024 PCP: Pcp, No   Brief HPI:   89 year old female with hypertension, hypothyroidism, GERD, chronic pain, and recurrent falls who presents with acute altered mental status and new left-sided weakness. Her sister found her morning of admission confused with new left arm and leg weakness, prompting EMS transport. At baseline, she is cognitively intact and ambulatory with a walker, without known focal neurologic deficits. Found to have a brain mass, seen by NS, poor surgical candidate. She was seen by palliative and transitioned to comfort care and reviewed by hospice.     Assessment/Plan:   Acute encephalopathy, left sided weakness:  -No severe metabolic derangements, most likely related to temporal mass with midline shift also explaining neurological deficit, also possibly post-concussive, and also possibly from polypharmacy. CK wnl.  - MRI:  Enhancing mass in the lateral right temporal lobe measuring 1.5 x 1.2 x 1.3 cm, with additional smaller enhancing nodules suggestive of satellite lesions.  Surrounding vasogenic edema and approximately 6 mm leftward midline shift. Findings suspicious for metastatic disease. - continue decadron  4 mg BID for now, can slowly wean down  - neurosurgery was consulted and she was deemed high risk surgical candidate.  - met with palliative and transitioned to comfort care. Seen by hospice but not an inpatient hospice candidate - continue bowel regimen, continue PRN antiemetics  - comfort care measures, PRN ativan , haldol , robinul , morphine      Suspected Acute CVA  - seen on MRI - hold off on aspirin as she is now comfort care   Hypothyroidism:  -On comfort care now.   Bradycardia:  -Resolved, metoprolol  DC'ed on 1/4.  Disposition Patient will go to skilled nursing facility with hospice follow-up -[facility] hospice following    DVT prophylaxis:  Yes  Medications     dexamethasone   4 mg Oral Q12H   lidocaine   1 patch Transdermal Q24H   oxybutynin   5 mg Oral Daily   polyethylene glycol  17 g Oral Daily   senna-docusate  1 tablet Oral BID   sodium chloride  flush  3 mL Intravenous Q12H     Data Reviewed:   CBG:  No results for input(s): GLUCAP in the last 168 hours.  SpO2: 96 % O2 Flow Rate (L/min): 2.5 L/min    Vitals:   06/07/24 0800 06/07/24 2026 06/08/24 0802 06/08/24 2057  BP: 135/66 (!) 160/66 (!) 146/52 (!) 159/62  Pulse: 67 60 62 61  Resp:  18 16 18   Temp: (!) 96.7 F (35.9 C) 97.7 F (36.5 C) 97.8 F (36.6 C) 97.7 F (36.5 C)  TempSrc:   Oral   SpO2: 96% 98% 99% 96%      Data Reviewed:   Antibiotics: Anti-infectives (From admission, onward)    Start     Dose/Rate Route Frequency Ordered Stop   05/26/24 1830  cefTRIAXone  (ROCEPHIN ) 1 g in sodium chloride  0.9 % 100 mL IVPB        1 g 200 mL/hr over 30 Minutes Intravenous  Once 05/26/24 1826 05/26/24 1909   05/26/24 1830  azithromycin  (ZITHROMAX ) 500 mg in sodium chloride  0.9 % 250 mL IVPB        500 mg 250 mL/hr over 60 Minutes Intravenous  Once 05/26/24 1826 05/26/24 2022        CONSULTS SCDs  Code Status: DNR/comfort  Family Communication: No family at bedside     Subjective    Patient seen and examined, denies any complaints.  Objective    Physical Examination:  Appears in no acute distress Alert, currently x 3, left-sided weakness S1-S2, regular, no murmur auscultated Lungs are clear to auscultation bilaterally Extremities no edema        [name]   [facility] If 7PM-7AM, please contact night-coverage at [email or web address], Office  [phone]   06/09/2024, 9:58 AM  LOS: 13 days

## 2024-06-09 NOTE — Plan of Care (Signed)
  Problem: Coping: Goal: Ability to identify and develop effective coping behavior will improve Outcome: Progressing   Problem: Clinical Measurements: Goal: Quality of life will improve Outcome: Progressing   Problem: Pain Management: Goal: Satisfaction with pain management regimen will improve Outcome: Progressing

## 2024-06-10 NOTE — Plan of Care (Signed)
" °  Problem: Pain Managment: Goal: General experience of comfort will improve and/or be controlled Outcome: Progressing   Problem: Pain Management: Goal: Satisfaction with pain management regimen will improve Outcome: Progressing   Problem: Skin Integrity: Goal: Risk for impaired skin integrity will decrease Outcome: Not Progressing   Problem: Health Behavior/Discharge Planning: Goal: Ability to manage health-related needs will improve Outcome: Not Progressing   "

## 2024-06-10 NOTE — Plan of Care (Signed)
" °  Problem: Self-Care: Goal: Ability to communicate needs accurately will improve Outcome: Progressing   Problem: Coping: Goal: Ability to identify and develop effective coping behavior will improve Outcome: Progressing   Problem: Clinical Measurements: Goal: Quality of life will improve Outcome: Progressing   "

## 2024-06-10 NOTE — Progress Notes (Signed)
 Pt continues w/ limited participation in Assessment d/t Refusal.  Pt continues to refuse Rx's, q2h Turns & SCD's.  Pt again acknowledges benefits & risks RE:  Plan of Care (& refusal thereof) & states I'm fine.  Just leave me alone.  Pt remains DNR-Comfort.  Lights dimmed for comfort, Callbell within reach.  Pt appears in no acute distress. Will continue to monitor.

## 2024-06-10 NOTE — Progress Notes (Signed)
 [facility]  PROGRESS NOTE  [name] FMW:[id number] DOB: [birth year] DOA: 05/26/2024 PCP: Pcp, No   Brief HPI:   89 year old female with hypertension, hypothyroidism, GERD, chronic pain, and recurrent falls who presents with acute altered mental status and new left-sided weakness. Her sister found her morning of admission confused with new left arm and leg weakness, prompting EMS transport. At baseline, she is cognitively intact and ambulatory with a walker, without known focal neurologic deficits. Found to have a brain mass, seen by NS, poor surgical candidate. She was seen by palliative and transitioned to comfort care and reviewed by hospice.     Assessment/Plan:   Acute encephalopathy, left sided weakness:  -No severe metabolic derangements, most likely related to temporal mass with midline shift also explaining neurological deficit, also possibly post-concussive, and also possibly from polypharmacy. CK wnl.  - MRI:  Enhancing mass in the lateral right temporal lobe measuring 1.5 x 1.2 x 1.3 cm, with additional smaller enhancing nodules suggestive of satellite lesions.  Surrounding vasogenic edema and approximately 6 mm leftward midline shift. Findings suspicious for metastatic disease. - continue decadron  4 mg BID for now, can slowly wean down  - neurosurgery was consulted and she was deemed high risk surgical candidate.  - met with palliative and transitioned to comfort care. Seen by hospice but not an inpatient hospice candidate - continue bowel regimen, continue PRN antiemetics  - comfort care measures, PRN ativan , haldol , robinul , morphine      Suspected Acute CVA  - seen on MRI - hold off on aspirin as she is now comfort care   Hypothyroidism:  -On comfort care now.   Bradycardia:  -Resolved, metoprolol  DC'ed on 1/4.  Disposition Patient will go to skilled nursing facility with hospice follow-up -[facility] hospice following    DVT prophylaxis:  Yes  Medications     dexamethasone   4 mg Oral Q12H   lidocaine   1 patch Transdermal Q24H   oxybutynin   5 mg Oral Daily   polyethylene glycol  17 g Oral Daily   senna-docusate  1 tablet Oral BID   sodium chloride  flush  3 mL Intravenous Q12H     Data Reviewed:   CBG:  No results for input(s): GLUCAP in the last 168 hours.  SpO2: 94 % O2 Flow Rate (L/min): 2.5 L/min    Vitals:   06/08/24 0802 06/08/24 2057 06/09/24 2010 06/10/24 0812  BP: (!) 146/52 (!) 159/62 (!) 159/60 (!) 141/54  Pulse: 62 61 64 61  Resp: 16 18  16   Temp: 97.8 F (36.6 C) 97.7 F (36.5 C) 98.2 F (36.8 C) 99.3 F (37.4 C)  TempSrc: Oral  Oral Axillary  SpO2: 99% 96% 94% 94%      Data Reviewed:   Antibiotics: Anti-infectives (From admission, onward)    Start     Dose/Rate Route Frequency Ordered Stop   05/26/24 1830  cefTRIAXone  (ROCEPHIN ) 1 g in sodium chloride  0.9 % 100 mL IVPB        1 g 200 mL/hr over 30 Minutes Intravenous  Once 05/26/24 1826 05/26/24 1909   05/26/24 1830  azithromycin  (ZITHROMAX ) 500 mg in sodium chloride  0.9 % 250 mL IVPB        500 mg 250 mL/hr over 60 Minutes Intravenous  Once 05/26/24 1826 05/26/24 2022        CONSULTS SCDs  Code Status: DNR/comfort  Family Communication: No family at bedside     Subjective   Patient was seen and examined at bedside during  morning rounds.  No significant events overnight.  Patient was resting comfortably, did not offer any complaints.   Objective    Physical Examination:  Appears in no acute distress Alert, currently x 3, left-sided weakness S1-S2, regular, no murmur auscultated Lungs are clear to auscultation bilaterally Extremities no edema    Anhelica Fowers Von   [facility] If 7PM-7AM, please contact night-coverage at [email or web address], Office  [phone]   06/10/2024, 2:36 PM  LOS: 14 days

## 2024-06-11 LAB — URINE CULTURE: Culture: 30000 — AB

## 2024-06-11 MED ORDER — LORAZEPAM 1 MG PO TABS: 1.0000 mg | ORAL_TABLET | ORAL | 0 refills | Status: AC | PRN

## 2024-06-11 MED ORDER — ORAL CARE MOUTH RINSE: 15.0000 mL | OROMUCOSAL | Status: DC | PRN

## 2024-06-11 MED ORDER — PANTOPRAZOLE SODIUM 40 MG PO TBEC: 40.0000 mg | DELAYED_RELEASE_TABLET | Freq: Every day | ORAL | Status: AC

## 2024-06-11 MED ORDER — DEXAMETHASONE 4 MG PO TABS: ORAL_TABLET | ORAL | Status: AC

## 2024-06-11 MED ORDER — PANTOPRAZOLE SODIUM 40 MG PO TBEC
40.0000 mg | DELAYED_RELEASE_TABLET | Freq: Every day | ORAL | Status: DC
  Administered 2024-06-11: 40 mg via ORAL

## 2024-06-11 MED ORDER — HYDROCODONE-ACETAMINOPHEN 5-325 MG PO TABS: 1.0000 | ORAL_TABLET | Freq: Four times a day (QID) | ORAL | 0 refills | Status: AC | PRN

## 2024-06-11 NOTE — Progress Notes (Signed)
 Report giving to Naylor at [place].

## 2024-06-11 NOTE — Discharge Summary (Signed)
 [facility] Discharge Summary   Patient: [name] FMW:[id number]  PCP: Pcp, No  Date of admission: 05/26/2024   Date of discharge:  06/11/2024     Discharge Diagnoses:  Principal Problem:   Left-sided weakness Active Problems:   Anxiety state   Hypothyroidism   Admitted From: Home Disposition:  SNF with Hospice f/u  Recommendations for Outpatient Follow-up:  Hospice care Follow up LABS/TEST:  None   Contact information for after-discharge care     Destination     [facility] [place], [place] .   Service: Skilled Nursing Contact information: [address] [place]  [address] [phone]                    Diet recommendation: Regular diet  Activity: The patient is advised to gradually reintroduce usual activities, as tolerated  Discharge Condition: stable  Code Status: DNR-comfort care  History of present illness: As per the H and P dictated on admission.  Hospital Course:  89 year old female with hypertension, hypothyroidism, GERD, chronic pain, and recurrent falls who presents with acute altered mental status and new left-sided weakness. Her sister found her morning of admission confused with new left arm and leg weakness, prompting EMS transport. At baseline, she is cognitively intact and ambulatory with a walker, without known focal neurologic deficits. Found to have a brain mass, seen by NS, poor surgical candidate. She was seen by palliative and transitioned to comfort care and reviewed by hospice.   Assessment/Plan:   # Acute encephalopathy, left sided weakness:  -No severe metabolic derangements, most likely related to temporal mass with midline shift also explaining neurological deficit, also possibly post-concussive, and also possibly from polypharmacy. CK wnl.  - MRI:  Enhancing mass in the lateral right temporal lobe measuring 1.5 x 1.2 x 1.3 cm, with additional smaller enhancing nodules suggestive of satellite  lesions.  Surrounding vasogenic edema and approximately 6 mm leftward midline shift. Findings suspicious for metastatic disease. - continue decadron  4 mg BID for now, can slowly wean down  - neurosurgery was consulted and she was deemed high risk surgical candidate.  - met with palliative and transitioned to comfort care. Seen by hospice but not an inpatient hospice candidate - continue bowel regimen, continue PRN antiemetics  - comfort care measures, PRN ativan , haldol , robinul , morphine     # Suspected Acute CVA  - seen on MRI - hold off on aspirin as she is now comfort care  # Hypothyroidism: On comfort care now. # Bradycardia: -Resolved, metoprolol  DC'ed on 1/4.   Disposition Patient will go to skilled nursing facility with hospice follow-up -[facility] hospice following   There is no height or weight on file to calculate BMI.  Nutrition Interventions:  On the day of the discharge the patient's vitals were stable, and no other acute medical condition were reported by patient. the patient was felt safe to be discharge at SNF with hospice services to follow with their..  Consultants: Neurosurgery Procedures: None  Discharge Exam: Appears in no acute distress Alert, currently x 3, left-sided weakness S1-S2, regular, no murmur auscultated Lungs are clear to auscultation bilaterally Extremities no edema    There were no vitals filed for this visit. Vitals:   06/10/24 2005 06/11/24 0806  BP: (!) 153/55 (!) 145/49  Pulse: (!) 56 (!) 57  Resp: 18 15  Temp: 98.1 F (36.7 C) 97.9 F (36.6 C)  SpO2: 94% 93%    DISCHARGE MEDICATION: Allergies as of 06/11/2024  Reactions   Codeine Nausea Only   Phenergan [promethazine Hcl] Other (See Comments)   Hallucinations        Medication List     STOP taking these medications    ALPRAZolam  0.5 MG tablet Commonly known as: XANAX    aspirin EC 81 MG tablet   hydrochlorothiazide 12.5 MG capsule Commonly known as:  MICROZIDE   metoprolol  succinate 50 MG 24 hr tablet Commonly known as: TOPROL -XL   Multivitamin Gummies Adults Chew   pregabalin 100 MG capsule Commonly known as: LYRICA       TAKE these medications    dexamethasone  4 MG tablet Commonly known as: DECADRON  Take 1 tablet (4 mg total) by mouth every 12 (twelve) hours for 14 days, THEN 1 tablet (4 mg total) daily. Start taking on: June 11, 2024   HYDROcodone -acetaminophen  5-325 MG tablet Commonly known as: NORCO/VICODIN Take 1 tablet by mouth every 6 (six) hours as needed for moderate pain (pain score 4-6) or severe pain (pain score 7-10). What changed: reasons to take this   levothyroxine  75 MCG tablet Commonly known as: SYNTHROID  Take 75 mcg by mouth daily before breakfast.   LORazepam  1 MG tablet Commonly known as: ATIVAN  Take 1 tablet (1 mg total) by mouth every 4 (four) hours as needed for anxiety or sedation.   nystatin cream Commonly known as: MYCOSTATIN Apply 1 Application topically 2 (two) times daily as needed (skin irritation).   oxybutynin  5 MG 24 hr tablet Commonly known as: DITROPAN -XL Take 5 mg by mouth daily.   pantoprazole  40 MG tablet Commonly known as: PROTONIX  Take 1 tablet (40 mg total) by mouth daily. Start taking on: June 12, 2024       Allergies[1] Discharge Instructions     Call MD for:  difficulty breathing, headache or visual disturbances   Complete by: As directed    Call MD for:  severe uncontrolled pain   Complete by: As directed    Discharge instructions   Complete by: As directed    Follow-up with hospice at SNF facility   Increase activity slowly   Complete by: As directed        The results of significant diagnostics from this hospitalization (including imaging, microbiology, ancillary and laboratory) are listed below for reference.    Significant Diagnostic Studies: MR Brain W and Wo Contrast Result Date: 05/27/2024 EXAM: MRI BRAIN WITH AND WITHOUT CONTRAST  05/27/2024 12:41:32 PM TECHNIQUE: Multiplanar multisequence MRI of the head/brain was performed with and without the administration of 8 mL of gadobutrol  (GADAVIST ) 1 MMOL/ML injection. Examination is limited due to motion artifact. COMPARISON: CTA head and neck 05/26/2024. CLINICAL HISTORY: Brain metastases, unknown primary; R temporal mass. FINDINGS: BRAIN AND VENTRICLES: There is a 1.5 x 1.2 x 1.3 cm enhancing mass in the lateral right temporal lobe corresponding to findings on CTA. There are additional smaller enhancing nodules suggestive of satellite lesions; the most pronounced such lesion is located posterior and superior to the dominant lesion and measures up to 1.0 cm (series 26, image 3). There is surrounding vasogenic edema throughout the right temporal lobe with associated mass effect and sulcal effacement. There is partial effacement of the right temporal horn and atrium of the right lateral ventricle. Approximately 6 mm leftward midline shift. There is a punctate focus of restricted diffusion in the right aspect of the splenium of corpus callosum concerning for small acute infarct (series 9, image 84). No significant associated edema. T2 and FLAIR hyperintensity in the periventricular and subcortical  white matter suggestive of mild chronic microvascular ischemic changes. Possible small remote infarct in the right cerebellum. There is an additional region of edema within the anterior paramedial left frontal lobe (series 19, image 19). This region is obscured by motion artifact on the postcontrast images. On the sagittal postcontrast images (series 26), this region is relatively free of artifact and there is no definite enhancement appreciated. Finding could reflect an additional metastatic lesion although no enhancing mass is appreciated on the current study. No acute intracranial hemorrhage. Normal flow voids. The sella is unremarkable. ORBITS: Bilateral lens replacement. SINUSES: No acute abnormality.  BONES AND SOFT TISSUES: Normal bone marrow signal and enhancement. No acute soft tissue abnormality. IMPRESSION: 1. Enhancing mass in the lateral right temporal lobe measuring 1.5 x 1.2 x 1.3 cm, with additional smaller enhancing nodules suggestive of satellite lesions. Surrounding vasogenic edema and approximately 6 mm leftward midline shift. Findings suspicious for metastatic disease. 2. Additional region of edema within the anterior paramedial left frontal lobe, obscured by motion artifact on postcontrast images, concerning for additional metastatic lesion. No definite enhancement appreciated. 3. Punctate acute infarct in the right aspect of the splenium of corpus callosum. Electronically signed by: Donnice Mania MD 05/27/2024 01:11 PM EST RP Workstation: [id number]   DG Shoulder Left Port Result Date: 05/27/2024 EXAM: 1 VIEW(S) XRAY OF THE LEFT SHOULDER 05/27/2024 12:37:00 AM COMPARISON: None available. CLINICAL HISTORY: Left shoulder pain. FINDINGS: BONES AND JOINTS: Moderate glenohumeral joint space narrowing and osteophyte formation. Mild hypertrophic changes of the acromioclavicular joint. No acute fracture. SOFT TISSUES: No abnormal calcifications. Visualized lung is unremarkable. IMPRESSION: 1. No acute findings. 2. Moderate glenohumeral osteoarthritis. 3. Mild acromioclavicular joint osteoarthritis. Electronically signed by: Greig Pique MD 05/27/2024 01:16 AM EST RP Workstation: [id number]   CT ANGIO HEAD NECK W WO CM Result Date: 05/26/2024 EXAM: CTA HEAD AND NECK WITH AND WITHOUT CT HEAD WITHOUT CONTRAST 05/26/2024 06:03:00 PM TECHNIQUE: CT of the head was performed without the administration of intravenous contrast. CTA of the head and neck was performed with and without the administration of intravenous contrast. Multiplanar 2D and/or 3D reformatted images are provided for review. Automated exposure control, iterative reconstruction, and/or weight based adjustment of the mA/kV was utilized to  reduce the radiation dose to as low as reasonably achievable. Stenosis of the internal carotid arteries measured using NASCET criteria. COMPARISON: None available CLINICAL HISTORY: Neuro deficit, acute, stroke suspected; Left arm and left leg weakness. Confusion. Recent fall. FINDINGS: Brain: Approximately 1.4 cm mass in the right temporal lobe with probable smaller adjacent mass, which is not well evaluated on this motion limited CT head. No obvious acute hemorrhage, large vascular territory infarct or hydrocephalus. Sinuses/mastoid: Clear. Calvarium: No acute fracture. AORTIC ARCH AND ARCH VESSELS: No dissection or arterial injury. No significant stenosis of the brachiocephalic or subclavian arteries. CERVICAL CAROTID ARTERIES: Approximately 70% stenosis of the right ICA origin due to atherosclerosis. Left ICA is patent without significant stenosis. CERVICAL VERTEBRAL ARTERIES: No dissection, arterial injury, or significant stenosis. LUNGS AND MEDIASTINUM: Unremarkable. SOFT TISSUES: No acute abnormality. BONES: No acute abnormality. ANTERIOR CIRCULATION: No significant stenosis of the internal carotid arteries. No significant stenosis of the anterior cerebral arteries. No significant stenosis of the middle cerebral arteries. No aneurysm. POSTERIOR CIRCULATION: No significant stenosis of the posterior cerebral arteries. No significant stenosis of the basilar artery. No significant stenosis of the vertebral arteries. No aneurysm. OTHER: No dural venous sinus thrombosis on this non-dedicated study. IMPRESSION: 1. Approximately 1.4 cm mass in the  right temporal lobe with probable smaller adjacent mass, which are not well evaluated on this motion limited CT head. Surrounding edema and 4 mm leftward midline shift. Recommend MRI Head with contrast. 2. No large vessel occlusion. 3. Approximately 70% stenosis of the right ICA origin. Electronically signed by: Gilmore Molt 05/26/2024 07:26 PM EST RP Workstation:  [id number]   CT CHEST ABDOMEN PELVIS W CONTRAST Result Date: 05/26/2024 EXAM: CT CHEST, ABDOMEN AND PELVIS WITH CONTRAST 05/26/2024 06:03:00 PM TECHNIQUE: CT of the chest, abdomen and pelvis was performed with the administration of 75 mL of iohexol  (OMNIPAQUE ) 350 MG/ML intravenous contrast. Multiplanar reformatted images are provided for review. Automated exposure control, iterative reconstruction, and/or weight based adjustment of the mA/kV was utilized to reduce the radiation dose to as low as reasonably achievable. COMPARISON: None available. CLINICAL HISTORY: Polytrauma, blunt; Hypoxia with recent fall, left hip pain. Traumatic injury or infection. FINDINGS: CHEST: MEDIASTINUM AND LYMPH NODES: Extensive multivessel coronary artery calcifications. Global cardiac size within normal limits. No pericardial effusion. The central airways are clear. No mediastinal, hilar or axillary lymphadenopathy. Central pulmonary arteries are of normal caliber. Mild atherosclerotic calcification within the thoracic aorta. No aortic aneurysm. LUNGS AND PLEURA: No focal consolidation or pulmonary edema. No pleural effusion or pneumothorax. ABDOMEN AND PELVIS: LIVER: Status post cholecystectomy. Nondependent pneumobilia suggests prior sphincterotomy. Liver otherwise unremarkable. GALLBLADDER AND BILE DUCTS: Status post cholecystectomy. Nondependent pneumobilia suggests prior sphincterotomy. No biliary ductal dilatation. SPLEEN: No acute abnormality. PANCREAS: No acute abnormality. ADRENAL GLANDS: No acute abnormality. KIDNEYS, URETERS AND BLADDER: 4 mm nonobstructing calculus within the interpolar region of the right kidney. Simple exophytic cortical cyst arises from the upper pole of the right kidney for which a follow up imaging is recommended. Per consensus, no follow-up is needed for simple Bosniak type 1 and 2 renal cysts, unless the patient has a malignancy history or risk factors. No ureteral calculi. No hydronephrosis. No  perinephric inflammatory stranding or fluid collections were identified. The bladder is unremarkable. GI AND BOWEL: Appendix normal. The stomach, small bowel, and large bowel are otherwise unremarkable. There is no bowel obstruction. REPRODUCTIVE ORGANS: Uterus absent. No adnexal masses. PERITONEUM AND RETROPERITONEUM: No ascites. No free air. VASCULATURE: Aorta is normal in caliber. Extensive aortoiliac atherosclerotic calcification. ABDOMINAL AND PELVIS LYMPH NODES: No lymphadenopathy. BONES AND SOFT TISSUES: Osseous structures are age appropriate. No acute bone abnormality. No lytic or blastic bone lesion. No focal soft tissue abnormality. IMPRESSION: 1. No acute findings in the chest, abdomen, or pelvis. 2. Extensive multivessel coronary artery calcifications. 3. Status post cholecystectomy with pneumobilia, likely related to prior sphincterotomy. 4. Prior hysterectomy. 5. 4 mm nonobstructing right renal calculus, without urolithiasis or hydronephrosis. 6. raf score includes Aortic atherosclerosis (icd10-i70.0). Electronically signed by: Dorethia Molt MD 05/26/2024 07:20 PM EST RP Workstation: [id number]   DG Chest Port 1 View Result Date: 05/26/2024 CLINICAL DATA:  Hypoxia. EXAM: PORTABLE CHEST 1 VIEW COMPARISON:  May 23, 2024 FINDINGS: The cardiac silhouette is mildly enlarged and unchanged in size. Low lung volumes are noted. Mild, stable diffusely increased interstitial lung markings are noted. Mild atelectasis and/or infiltrate is seen within the mid left lung. No pleural effusion or pneumothorax is identified. The visualized skeletal structures are unremarkable. IMPRESSION: 1. Low lung volumes with mild mid left lung atelectasis and/or infiltrate. A mild superimposed component of interstitial edema cannot be excluded. Electronically Signed   By: Suzen Dials M.D.   On: 05/26/2024 17:22   CT Lumbar Spine Wo Contrast Result Date: 05/23/2024 EXAM:  CT OF THE LUMBAR SPINE WITHOUT CONTRAST  05/23/2024 02:08:15 PM TECHNIQUE: CT of the lumbar spine was performed without the administration of intravenous contrast. Multiplanar reformatted images are provided for review. Automated exposure control, iterative reconstruction, and/or weight based adjustment of the mA/kV was utilized to reduce the radiation dose to as low as reasonably achievable. COMPARISON: None available. CLINICAL HISTORY: Back trauma, no prior imaging (Age >= 16y). FINDINGS: BONES AND ALIGNMENT: 5 lumbar type vertebrae. Trace anterolisthesis of L4 on L5. No acute fracture or suspicious bone lesion. DEGENERATIVE CHANGES: Overall mild for age lumbar disc degeneration. Asymmetrically advanced facet arthrosis on the right at L4-L5 and L5-S1. Previous left laminectomy and partial facetectomy at L4-L5. Moderate neural foraminal stenosis on the left at L4-L5 and on the right at L5-S1. No evidence of high grade spinal canal stenosis. SOFT TISSUES: Nonobstructing bilateral renal calculi, with the largest measuring 4 mm on the right. Aortic atherosclerosis. Colonic diverticulosis. IMPRESSION: 1. No acute osseous abnormality in the lumbar spine. 2. Multilevel disc and facet degeneration without evidence of high-grade spinal stenosis. 3. Nonobstructing bilateral nephrolithiasis. Electronically signed by: Dasie Hamburg MD 05/23/2024 03:01 PM EST RP Workstation: [id number]   CT Thoracic Spine Wo Contrast Result Date: 05/23/2024 EXAM: CT THORACIC SPINE WITHOUT CONTRAST 05/23/2024 02:08:15 PM TECHNIQUE: CT of the thoracic spine was performed without the administration of intravenous contrast. Multiplanar reformatted images are provided for review. Automated exposure control, iterative reconstruction, and/or weight based adjustment of the mA/kV was utilized to reduce the radiation dose to as low as reasonably achievable. COMPARISON: None available. CLINICAL HISTORY: Back trauma, no prior imaging (Age >= 16y) FINDINGS: BONES AND ALIGNMENT: Trace  anterolisthesis of T1 on T2 and trace retrolisthesis of T12 on L1. No acute fracture or suspicious bone lesion. DEGENERATIVE CHANGES: Overall mild for age thoracic disc degeneration. No evidence of high grade spinal canal stenosis. SOFT TISSUES: Aortic and coronary atherosclerosis. Nonobstructing right renal calculus. No acute abnormality. IMPRESSION: 1. No acute osseous abnormality in the thoracic spine. Electronically signed by: Dasie Hamburg MD 05/23/2024 02:57 PM EST RP Workstation: [id number]   CT Cervical Spine Wo Contrast Result Date: 05/23/2024 EXAM: CT CERVICAL SPINE WITHOUT CONTRAST 05/23/2024 02:08:15 PM TECHNIQUE: CT of the cervical spine was performed without the administration of intravenous contrast. Multiplanar reformatted images are provided for review. Automated exposure control, iterative reconstruction, and/or weight based adjustment of the mA/kV was utilized to reduce the radiation dose to as low as reasonably achievable. COMPARISON: CT cervical spine 05/27/2013. CLINICAL HISTORY: Neck trauma (Age >= 65y). FINDINGS: BONES AND ALIGNMENT: Cervical spine straightening. Trace anterolisthesis of C3 on C4 and C7 on T1, degenerative in appearance. No acute fracture or suspicious lesion. DEGENERATIVE CHANGES: Disc degeneration in the mid and lower cervical spine, with disc space narrowing and degenerative endplate changes being most advanced at C4-C5 and C5-C6. Upper cervical facet arthrosis, most severe on the right at C3-C4 with associated moderate right neural foraminal stenosis. Moderate neural foraminal stenosis bilaterally at C4-C5 and C5-C6 due to uncovertebral spurring. Suspected mild multilevel spinal stenosis. SOFT TISSUES: No prevertebral soft tissue swelling. IMPRESSION: 1. No acute cervical spine fracture or traumatic malalignment. Electronically signed by: Dasie Hamburg MD 05/23/2024 02:42 PM EST RP Workstation: [id number]   DG Chest 1 View Result Date: 05/23/2024 EXAM: 1 VIEW(S) XRAY  OF THE CHEST 05/23/2024 01:57:00 PM COMPARISON: None available. CLINICAL HISTORY: [id number] Fall [id number] FINDINGS: LUNGS AND PLEURA: Low lung volumes. No focal pulmonary opacity. No pleural effusion. No pneumothorax. HEART AND MEDIASTINUM: Mild cardiomegaly.  Aortic arch atherosclerosis. BONES AND SOFT TISSUES: Cholecystectomy clips noted. No acute osseous abnormality. IMPRESSION: 1. No acute airspace disease. Mild cardiomegaly. Electronically signed by: Michaeline Blanch MD 05/23/2024 02:09 PM EST RP Workstation: [id number]   DG Knee 2 Views Left Result Date: 05/23/2024 EXAM: 1 OR 2 VIEW(S) XRAY OF THE LEFT KNEE 05/23/2024 01:57:00 PM COMPARISON: None available. CLINICAL HISTORY: falls FINDINGS: BONES AND JOINTS: No acute fracture. No malalignment. Small pedunculated osteochondroma from proximal medial tibial metaphysis. Moderate joint space narrowing, subchondral sclerosis and degenerative osteophytosis. Diffuse osteopenia. Possible trace joint effusion. SOFT TISSUES: Vascular calcifications. IMPRESSION: 1. No acute osseous abnormality. 2. Moderate knee osteoarthrosis. Electronically signed by: Michaeline Blanch MD 05/23/2024 02:06 PM EST RP Workstation: [id number]   DG Pelvis 1-2 Views Result Date: 05/23/2024 EXAM: 1 OR 2 VIEW(S) XRAY OF THE PELVIS 05/23/2024 01:57:00 PM COMPARISON: 05/27/2013 CLINICAL HISTORY: falls FINDINGS: BONES AND JOINTS: Degenerative changes of the lower lumbar spine. Osteopenia. No acute fracture. No malalignment. SOFT TISSUES: Surgical clips in lower abdomen. IMPRESSION: 1. No acute osseous abnormality. Electronically signed by: Michaeline Blanch MD 05/23/2024 02:04 PM EST RP Workstation: [id number]    Microbiology: Recent Results (from the past 240 hours)  Urine Culture (for pregnant, neutropenic or urologic patients or patients with an indwelling urinary catheter)     Status: Abnormal   Collection Time: 06/08/24  5:37 PM   Specimen: Urine, Clean Catch  Result Value Ref Range Status    Specimen Description URINE, CLEAN CATCH  Final   Special Requests   Final    NONE Performed at [facility], [address], [place], [place] [address]    Culture MULTIPLE SPECIES PRESENT, SUGGEST RECOLLECTION (A)  Final   Report Status 06/11/2024 FINAL  Final     Labs: CBC: No results for input(s): WBC, NEUTROABS, HGB, HCT, MCV, PLT in the last 168 hours. Basic Metabolic Panel: No results for input(s): NA, K, CL, CO2, GLUCOSE, BUN, CREATININE, CALCIUM , MG, PHOS in the last 168 hours. Liver Function Tests: No results for input(s): AST, ALT, ALKPHOS, BILITOT, PROT, ALBUMIN  in the last 168 hours. No results for input(s): LIPASE, AMYLASE in the last 168 hours. No results for input(s): AMMONIA in the last 168 hours. Cardiac Enzymes: No results for input(s): CKTOTAL, CKMB, CKMBINDEX, TROPONINI in the last 168 hours. BNP (last 3 results) No results for input(s): BNP in the last 8760 hours. CBG: No results for input(s): GLUCAP in the last 168 hours.  Time spent: 35 minutes  Signed:  Elvan Sor  [facility] 06/11/2024 1:34 PM      [1]  Allergies Allergen Reactions   Codeine Nausea Only   Phenergan [Promethazine Hcl] Other (See Comments)    Hallucinations

## 2024-06-11 NOTE — TOC Transition Note (Signed)
 Transition of Care [facility]) - Discharge Note   Patient Details  Name: [name] MRN: [id number] Date of Birth: [birth year]  Transition of [facility] CM/SW Contact:  Sherline Clack, LCSWA Phone Number: 06/11/2024, 2:14 PM   Clinical Narrative:     Patient will DC to: [facility] Anticipated DC date: 06/11/2024  Family notified: [name] Transport by: [facility]   Per MD patient ready for DC to [facility]. RN to call report prior to discharge [phone], [phone] ). RN, patient, patient's family, and facility notified of DC. Discharge Summary and FL2 sent to facility. DC packet on chart. Ambulance transport requested for patient.   CSW will sign off for now as social work intervention is no longer needed. Please consult us  again if new needs arise.    Final next level of care: Skilled Nursing Facility Barriers to Discharge: Barriers Resolved   Patient Goals and CMS Choice     Choice offered to / list presented to : Sibling      Discharge Placement              Patient chooses bed at: [facility] Patient to be transferred to facility by: [facility] Name of family member notified: [name]/sister: [phone] Patient and family notified of of transfer: 06/11/24  Discharge Plan and Services Additional resources added to the After Visit Summary for                                       Social Drivers of Health (SDOH) Interventions SDOH Screenings   Food Insecurity: Patient Unable To Answer (05/28/2024)  Housing: Unknown (05/28/2024)  Transportation Needs: Patient Unable To Answer (05/28/2024)  Utilities: Patient Unable To Answer (05/28/2024)  Financial Resource Strain: Low Risk (07/12/2023)   Received from [facility]   Social Connections: Patient Unable To Answer (05/28/2024)  Tobacco Use: Low Risk (05/26/2024)     Readmission Risk Interventions     No data to display
